# Patient Record
Sex: Female | Born: 1970 | Race: Black or African American | Hispanic: No | Marital: Married
Health system: Midwestern US, Community
[De-identification: ages and names within clinical notes are randomized; demographics above are authoritative.]

## PROBLEM LIST (undated history)

## (undated) DIAGNOSIS — M7540 Impingement syndrome of unspecified shoulder: Secondary | ICD-10-CM

## (undated) DIAGNOSIS — J40 Bronchitis, not specified as acute or chronic: Secondary | ICD-10-CM

## (undated) DIAGNOSIS — M1990 Unspecified osteoarthritis, unspecified site: Secondary | ICD-10-CM

## (undated) DIAGNOSIS — M719 Bursopathy, unspecified: Secondary | ICD-10-CM

## (undated) DIAGNOSIS — G43719 Chronic migraine without aura, intractable, without status migrainosus: Secondary | ICD-10-CM

## (undated) DIAGNOSIS — G43109 Migraine with aura, not intractable, without status migrainosus: Secondary | ICD-10-CM

## (undated) DIAGNOSIS — J45909 Unspecified asthma, uncomplicated: Secondary | ICD-10-CM

## (undated) DIAGNOSIS — K219 Gastro-esophageal reflux disease without esophagitis: Secondary | ICD-10-CM

## (undated) HISTORY — PX: UTERINE FIBROID SURGERY: SHX826

## (undated) HISTORY — PX: SHOULDER SURGERY: SHX246

## (undated) HISTORY — DX: Chronic migraine without aura, intractable, without status migrainosus: G43.719

## (undated) HISTORY — PX: BREAST SURGERY: SHX581

## (undated) HISTORY — PX: NECK SURGERY: SHX720

## (undated) HISTORY — DX: Gastro-esophageal reflux disease without esophagitis: K21.9

## (undated) HISTORY — DX: Migraine with aura, not intractable, without status migrainosus: G43.109

---

## 1997-10-28 ENCOUNTER — Other Ambulatory Visit: Admission: RE | Admit: 1997-10-28 | Discharge: 1997-10-28 | Payer: Self-pay | Admitting: Obstetrics and Gynecology

## 1999-01-07 ENCOUNTER — Other Ambulatory Visit: Admission: RE | Admit: 1999-01-07 | Discharge: 1999-01-07 | Payer: Self-pay | Admitting: Obstetrics and Gynecology

## 1999-01-21 ENCOUNTER — Other Ambulatory Visit: Admission: RE | Admit: 1999-01-21 | Discharge: 1999-01-21 | Payer: Self-pay | Admitting: Obstetrics and Gynecology

## 1999-01-21 ENCOUNTER — Encounter (INDEPENDENT_AMBULATORY_CARE_PROVIDER_SITE_OTHER): Payer: Self-pay | Admitting: Specialist

## 2000-07-18 ENCOUNTER — Inpatient Hospital Stay (HOSPITAL_COMMUNITY): Admission: AD | Admit: 2000-07-18 | Discharge: 2000-07-18 | Payer: Self-pay | Admitting: Obstetrics and Gynecology

## 2002-01-15 ENCOUNTER — Emergency Department (HOSPITAL_COMMUNITY): Admission: EM | Admit: 2002-01-15 | Discharge: 2002-01-16 | Payer: Self-pay | Admitting: Emergency Medicine

## 2002-01-16 ENCOUNTER — Encounter: Payer: Self-pay | Admitting: Emergency Medicine

## 2002-06-04 ENCOUNTER — Other Ambulatory Visit: Admission: RE | Admit: 2002-06-04 | Discharge: 2002-06-04 | Payer: Self-pay | Admitting: Obstetrics & Gynecology

## 2003-03-27 ENCOUNTER — Ambulatory Visit (HOSPITAL_COMMUNITY): Admission: RE | Admit: 2003-03-27 | Discharge: 2003-03-27 | Payer: Self-pay | Admitting: Gastroenterology

## 2003-08-09 ENCOUNTER — Other Ambulatory Visit: Admission: RE | Admit: 2003-08-09 | Discharge: 2003-08-09 | Payer: Self-pay | Admitting: Obstetrics & Gynecology

## 2004-06-04 ENCOUNTER — Emergency Department: Payer: Self-pay | Admitting: Emergency Medicine

## 2009-03-31 ENCOUNTER — Emergency Department (HOSPITAL_COMMUNITY): Admission: EM | Admit: 2009-03-31 | Discharge: 2009-03-31 | Payer: Self-pay | Admitting: Emergency Medicine

## 2009-04-11 ENCOUNTER — Emergency Department (HOSPITAL_COMMUNITY): Admission: EM | Admit: 2009-04-11 | Discharge: 2009-04-11 | Payer: Self-pay | Admitting: Family Medicine

## 2009-11-26 ENCOUNTER — Ambulatory Visit: Payer: Self-pay | Admitting: Orthopedic Surgery

## 2010-02-05 ENCOUNTER — Ambulatory Visit: Payer: Self-pay | Admitting: Pain Medicine

## 2010-02-18 ENCOUNTER — Ambulatory Visit: Payer: Self-pay | Admitting: Pain Medicine

## 2010-03-11 ENCOUNTER — Encounter
Admission: RE | Admit: 2010-03-11 | Discharge: 2010-03-11 | Payer: Self-pay | Source: Home / Self Care | Admitting: Neurosurgery

## 2010-07-09 ENCOUNTER — Other Ambulatory Visit: Payer: Self-pay | Admitting: Obstetrics and Gynecology

## 2010-07-09 DIAGNOSIS — Z1231 Encounter for screening mammogram for malignant neoplasm of breast: Secondary | ICD-10-CM

## 2010-07-15 ENCOUNTER — Ambulatory Visit
Admission: RE | Admit: 2010-07-15 | Discharge: 2010-07-15 | Disposition: A | Discharge status: Home / Self Care | Payer: BLUE CROSS/BLUE SHIELD | Source: Ambulatory Visit | Attending: Obstetrics and Gynecology | Admitting: Obstetrics and Gynecology

## 2010-07-15 DIAGNOSIS — Z1231 Encounter for screening mammogram for malignant neoplasm of breast: Secondary | ICD-10-CM

## 2010-08-21 NOTE — Op Note (Signed)
[name], Walls                         ACCOUNT NO.:  [id number]   MEDICAL RECORD NO.:  [id number]                   PATIENT TYPE:  AMB   LOCATION:  ENDO                                 FACILITY:  [facility]   PHYSICIAN:  Graylin Shiver, M.D.                DATE OF BIRTH:  1971/02/12   DATE OF PROCEDURE:  03/27/2003  DATE OF DISCHARGE:                                 OPERATIVE REPORT   PROCEDURE PERFORMED:  Colonoscopy.   INDICATIONS FOR PROCEDURE:  Rectal bleeding.   Informed consent was obtained after explanation of the risks of bleeding,  infection, and perforation.   PREMEDICATIONS:  Fentanyl 70 mcg  IV, Versed 7 mg IV.   DESCRIPTION OF PROCEDURE:  With the patient in the left lateral decubitus  position, a rectal exam was performed and no masses were felt.  The Olympus  colonoscope was inserted into the rectum and advanced around the colon to  the cecum.  Cecal landmarks were identified.  The cecum and ascending colon  were normal.  The transverse colon was normal.  The descending colon,  sigmoid and rectum were normal.  The scope was retroflexed in the rectum and  no abnormalities were seen. The scope was straightened and drawn out.  The  patient tolerated the procedure well without complications.   IMPRESSION:  Normal colonoscopy to the cecum.   I suspect that the bleeding that she had was probably an anal source.  I see  nothing on this examination to explain the bleeding.                                               Graylin Shiver, M.D.    SFG/MEDQ  D:  03/27/2003  T:  03/28/2003  Job:  [id number]   cc:   Schuyler Amor, M.D.  [address]  [place], [place] [address]  Fax: [phone]

## 2012-07-22 ENCOUNTER — Emergency Department (HOSPITAL_COMMUNITY)
Admission: EM | Admit: 2012-07-22 | Discharge: 2012-07-22 | Disposition: A | Discharge status: Home / Self Care | Payer: BC Managed Care – PPO | Attending: Emergency Medicine | Admitting: Emergency Medicine

## 2012-07-22 ENCOUNTER — Encounter (HOSPITAL_COMMUNITY): Payer: Self-pay

## 2012-07-22 DIAGNOSIS — Z79899 Other long term (current) drug therapy: Secondary | ICD-10-CM | POA: Insufficient documentation

## 2012-07-22 DIAGNOSIS — J029 Acute pharyngitis, unspecified: Secondary | ICD-10-CM

## 2012-07-22 DIAGNOSIS — R059 Cough, unspecified: Secondary | ICD-10-CM | POA: Insufficient documentation

## 2012-07-22 DIAGNOSIS — J45909 Unspecified asthma, uncomplicated: Secondary | ICD-10-CM | POA: Insufficient documentation

## 2012-07-22 DIAGNOSIS — R05 Cough: Secondary | ICD-10-CM | POA: Insufficient documentation

## 2012-07-22 HISTORY — DX: Unspecified asthma, uncomplicated: J45.909

## 2012-07-22 HISTORY — DX: Bronchitis, not specified as acute or chronic: J40

## 2012-07-22 LAB — RAPID STREP SCREEN (MED CTR MEBANE ONLY): Streptococcus, Group A Screen (Direct): NEGATIVE

## 2012-07-22 NOTE — ED Notes (Signed)
Sore throat 07/03/12. Seen and treated at urgent care for strep with Z pack.  Pt continues to be symptomatic.

## 2012-07-22 NOTE — ED Provider Notes (Signed)
History     CSN: [id number]  Arrival date & time 07/22/12  1107   First MD Initiated Contact with Patient 07/22/12 1204      Chief Complaint  Patient presents with  . Sore Throat    (Consider location/radiation/quality/duration/timing/severity/associated sxs/prior treatment) HPI Comments: 42 y.o. Female presents with sore throat x3 weeks. Was seen at urgent care (07/03/12) for same (pt says strep test was inconclusive) and treated with Z pack. Got better for a while, but now has sore throat again, although not as bad.  Pt denies fever. Admits dry cough and says she sees white spots on tonsills. Denies fever, nausea, vomiting, sinus pressure, rhinorrhea, ear pain.   Patient is a 42 y.o. female presenting with pharyngitis.  Sore Throat Associated symptoms include coughing and a sore throat. Pertinent negatives include no chest pain, chills, fever, headaches, nausea, neck pain, numbness, rash, vomiting or weakness.    Past Medical History  Diagnosis Date  . Asthma   . Bronchitis     Past Surgical History  Procedure Laterality Date  . Breast surgery      History reviewed. No pertinent family history.  History  Substance Use Topics  . Smoking status: Never Smoker   . Smokeless tobacco: Not on file  . Alcohol Use: Yes    OB History   Grav Para Term Preterm Abortions TAB SAB Ect Mult Living                  Review of Systems  Constitutional: Negative for fever and chills.  HENT: Positive for sore throat. Negative for ear pain, trouble swallowing, neck pain, neck stiffness and sinus pressure.   Eyes: Negative for visual disturbance.  Respiratory: Positive for cough. Negative for apnea, chest tightness, shortness of breath and wheezing.        Mild dry persistent cough  Cardiovascular: Negative for chest pain and palpitations.  Gastrointestinal: Negative for nausea, vomiting, diarrhea and constipation.  Genitourinary: Negative for dysuria.  Skin: Negative for rash.   Neurological: Negative for dizziness, weakness, light-headedness, numbness and headaches.    Allergies  Amoxicillin and Maxalt  Home Medications   Current Outpatient Rx  Name  Route  Sig  Dispense  Refill  . albuterol (PROVENTIL HFA;VENTOLIN HFA) 108 (90 BASE) MCG/ACT inhaler   Inhalation   Inhale 2 puffs into the lungs every 6 (six) hours as needed for wheezing.         Marland Kitchen aspirin-acetaminophen-caffeine (EXCEDRIN MIGRAINE) 250-250-65 MG per tablet   Oral   Take 1 tablet by mouth every 6 (six) hours as needed for pain.         . Biotin 1 MG CAPS   Oral   Take 1 g by mouth daily.         Marland Kitchen eletriptan (RELPAX) 40 MG tablet   Oral   One tablet by mouth at onset of headache. May repeat in 2 hours if headache persists or recurs. may repeat in 2 hours if necessary         . lidocaine (LIDODERM) 5 %   Transdermal   Place 1 patch onto the skin daily as needed. On foot. Remove & Discard patch within 12 hours or as directed by MD         . vitamin B-12 (CYANOCOBALAMIN) 1000 MCG tablet   Oral   Take 1,000 mcg by mouth daily.           BP 113/77  Pulse 84  Temp(Src) 98.7 F (  37.1 C) (Oral)  Resp 16  SpO2 100%  LMP 07/20/2012  Physical Exam  Nursing note and vitals reviewed. Constitutional: She is oriented to person, place, and time. She appears well-developed and well-nourished. No distress.  HENT:  Head: Normocephalic and atraumatic. No trismus in the jaw.  Right Ear: Tympanic membrane normal. No drainage.  Left Ear: Tympanic membrane normal. No drainage.  Nose: No mucosal edema or rhinorrhea. Right sinus exhibits no maxillary sinus tenderness and no frontal sinus tenderness. Left sinus exhibits no maxillary sinus tenderness and no frontal sinus tenderness.  Mouth/Throat: No edematous. Posterior oropharyngeal erythema present. No oropharyngeal exudate, posterior oropharyngeal edema or tonsillar abscesses.  Eyes: Conjunctivae and EOM are normal.  Neck: Normal  range of motion. Neck supple.  No meningeal signs  Cardiovascular: Normal rate, regular rhythm and normal heart sounds.  Exam reveals no gallop and no friction rub.   No murmur heard. Pulmonary/Chest: Effort normal and breath sounds normal. No respiratory distress. She has no wheezes. She has no rales. She exhibits no tenderness.  Abdominal: Soft. Bowel sounds are normal. She exhibits no distension. There is no tenderness. There is no rebound and no guarding.  Musculoskeletal: Normal range of motion. She exhibits no edema and no tenderness.  Neurological: She is alert and oriented to person, place, and time. No cranial nerve deficit.  Skin: Skin is warm and dry. She is not diaphoretic. No erythema.  Psychiatric: She has a normal mood and affect.    ED Course  Procedures (including critical care time)  Labs Reviewed  RAPID STREP SCREEN   No results found.   1. Viral pharyngitis       MDM  42 y.o. Female presents with sore throat x3 weeks. Treated with Z pack. Got better for a while, but now has sore throat again, although not as bad.  Afebrile, dry cough, without tonsillar exudates, mild erythema. Negative strep. DC w symptomatic tx.  Pt does not appear dehydrated, but did discuss importance of water rehydration. Presentation non concerning for PTA or infxn spread to soft tissue. No trismus or uvula deviation. Specific return precautions discussed.  Recommended PCP follow up. Provided resource guide.    Glade Nurse, PA-C 07/22/12 [phone]

## 2012-07-22 NOTE — ED Provider Notes (Signed)
Medical screening examination/treatment/procedure(s) were performed by non-physician practitioner and as supervising physician I was immediately available for consultation/collaboration.  Twanda Stakes, MD 07/22/12 1652 

## 2013-01-19 ENCOUNTER — Telehealth: Payer: Self-pay | Admitting: Neurology

## 2013-01-24 NOTE — Telephone Encounter (Signed)
Office in [place] will fax over new referral for a scheduled appt. W/ Dr Vickey Huger

## 2013-01-29 ENCOUNTER — Encounter: Payer: Self-pay | Admitting: Neurology

## 2013-01-29 ENCOUNTER — Encounter (INDEPENDENT_AMBULATORY_CARE_PROVIDER_SITE_OTHER): Payer: Self-pay

## 2013-01-29 ENCOUNTER — Ambulatory Visit (INDEPENDENT_AMBULATORY_CARE_PROVIDER_SITE_OTHER): Payer: BC Managed Care – PPO | Admitting: Neurology

## 2013-01-29 VITALS — BP 143/84 | HR 80 | Temp 97.9°F | Ht 59.5 in | Wt 209.0 lb

## 2013-01-29 DIAGNOSIS — Z8249 Family history of ischemic heart disease and other diseases of the circulatory system: Secondary | ICD-10-CM

## 2013-01-29 DIAGNOSIS — G43719 Chronic migraine without aura, intractable, without status migrainosus: Secondary | ICD-10-CM

## 2013-01-29 DIAGNOSIS — E669 Obesity, unspecified: Secondary | ICD-10-CM

## 2013-01-29 DIAGNOSIS — Z823 Family history of stroke: Secondary | ICD-10-CM

## 2013-01-29 DIAGNOSIS — R51 Headache: Secondary | ICD-10-CM

## 2013-01-29 DIAGNOSIS — R0683 Snoring: Secondary | ICD-10-CM

## 2013-01-29 DIAGNOSIS — R0609 Other forms of dyspnea: Secondary | ICD-10-CM

## 2013-01-29 MED ORDER — PROPRANOLOL HCL ER 80 MG PO CP24: 80.0000 mg | ORAL_CAPSULE | Freq: Every day | ORAL | Status: AC

## 2013-01-29 NOTE — Patient Instructions (Signed)
Please remember, common headache triggers are: sleep deprivation, dehydration, overheating, stress, hypoglycemia or skipping meals and blood sugar fluctuations, excessive pain medications or excessive alcohol use or caffeine withdrawal. Some people have food triggers such as aged cheese, orange juice or chocolate, especially dark chocolate, or MSG (monosodium glutamate). Try to avoid these headache triggers as much possible. It may be helpful to keep a headache diary to figure out what makes your headaches worse or brings them on and what alleviates them. Some people report headache onset after exercise but studies have shown that regular exercise may actually prevent headaches from coming. If you have exercise-induced headaches, please make sure that you drink plenty of fluid before and after exercising and that you do not over do it and do not overheat.  Please remember to try to maintain good sleep hygiene, which means: Keep a regular sleep and wake schedule, try not to exercise or have a meal within 2 hours of your bedtime, try to keep your bedroom conducive for sleep, that is, cool and dark, without light distractors such as an illuminated alarm clock, and refrain from watching TV right before sleep or in the middle of the night and do not keep the TV or radio on during the night. Also, try not to use or play on electronic devices at bedtime, such as your cell phone, tablet PC or laptop. If you like to read at bedtime on an electronic device, try to dim the background light as much as possible.  Based on your symptoms and your exam I believe you are at risk for obstructive sleep apnea or OSA, and I think we should proceed with a sleep study to determine whether you do or do not have OSA and how severe it is. If you have more than mild OSA, I want you to consider treatment with CPAP. Please remember, the risks and ramifications of moderate to severe obstructive sleep apnea or OSA are: Cardiovascular disease,  including congestive heart failure, stroke, difficult to control hypertension, arrhythmias, and even type 2 diabetes has been linked to untreated OSA. Sleep apnea causes disruption of sleep and sleep deprivation in most cases, which, in turn, can cause recurrent headaches, problems with memory, mood, concentration, focus, and vigilance. Most people with untreated sleep apnea report excessive daytime sleepiness, which can affect their ability to drive. Please do not drive if you feel sleepy.  I will see you back after your sleep study to go over the test results and where to go from there. We will call you after your sleep study and to set up an appointment at the time.

## 2013-01-29 NOTE — Progress Notes (Signed)
Subjective:    Patient ID: Rebecca Walls is a 42 y.o. female.  HPI  Huston Foley, MD, PhD [facility] [address], [address] [address] [place], [place] [address]  Dear Dr. Yetta Barre,   I saw your patient, Rebecca Walls, upon your kind request in my neurologic clinic today for initial consultation of her migraine headaches. The patient is unaccompanied today. As you know, Rebecca Walls is a very pleasant 42 year old right-handed woman with an underlying medical history of reflux disease and uterine fibroids, who has had intermittent headaches for over 10 years. She has seen neurologists in the past and has recently been following with Dr. Modesta Messing. She has also seen Dr. Vickey Huger in 2010 and our NP, Rebecca Daphine Deutscher in 2011, last about a year ago.   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 She has a throbbing HA, and has daily HAs for the past 2 years. She is sensitive to smells, especially fragrances. She left 3 jobs, because of perfumes at work. 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 She has a nuclear stress test coming up this week. She has neck pain. She has associated nausea, some vomiting. She has 30 HA days/month. She has a family history of migraine and brain aneurysm. She has had 3 or 4 brain MRI, per patient, she has not had a MRA head.  Treatments tried include imipramine, topamax, Inderal, Zonegran, trigger point injections, cymbalta, sertraline, atenolol, all without success and some with SEs. She has tried Relpax, Imitrex, and Maxalt, all without help. She has not tried Botox, but has considered it with Dr. Modesta Messing, her neurologist in [facility].   The patient denies prior TIA or stroke symptoms, such as sudden onset of one sided weakness, numbness, tingling, slurring of speech or droopy face, hearing loss, tinnitus, diplopia or visual field cut or monocular loss of vision, but in 2006, she had auras and one time had a  migraine with complication. Of note, the patient admits to snoring, and there is no report of witnessed apneas or choking sensations while asleep, but she has woken up with palpitations. There is associated EDS and the ESS is 4/24 today. She reports morning HAs and tooth grinding at night and she saw ENT for TMJ problems; she was told she needed a mouth guard.  Her Past Medical History Is Significant For: Past Medical History  Diagnosis Date  . Asthma   . Bronchitis   . Migraine with aura, without mention of intractable migraine without mention of status migrainosus   . GERD (gastroesophageal reflux disease)     Her Past Surgical History Is Significant For: Past Surgical History  Procedure Laterality Date  . Breast surgery      reduction  . Uterine fibroid surgery      Her Family History Is Significant For: Family History  Problem Relation Age of Onset  . Diabetes Mother   . COPD Mother   . Hypertension Father   . Diabetes Father     Her Social History Is Significant For: History   Social History  . Marital Status: Married    Spouse Name: N/A    Number of Children: N/A  . Years of Education: N/A   Social History Main Topics  . Smoking status: Never Smoker   . Smokeless tobacco: None  . Alcohol Use: Yes  . Drug Use: No  . Sexual Activity: None   Other Topics Concern  .  None   Social History Narrative  . None    Her Allergies Are:  Allergies  Allergen Reactions  . Amoxicillin Hives, Swelling and Rash    Throat closes in  . Maxalt [Rizatriptan] Hives, Swelling and Rash    Throat closes in  :   Her Current Medications Are:  Outpatient Encounter Prescriptions as of 01/29/2013  Medication Sig Dispense Refill  . albuterol (PROVENTIL HFA;VENTOLIN HFA) 108 (90 BASE) MCG/ACT inhaler Inhale 2 puffs into the lungs every 6 (six) hours as needed for wheezing.      . Biotin 1 MG CAPS Take 1 g by mouth daily.      . butalbital-acetaminophen-caffeine (FIORICET, ESGIC)  50-325-40 MG per tablet Take 1 tablet by mouth as needed.      . isometheptene-acetaminophen-dichloralphenazone (MIDRIN) 65-325-100 MG capsule Take 1 capsule by mouth 4 (four) times daily as needed. Maximum 5 capsules in 12 hours for migraine headaches, 8 capsules in 24 hours for tension headaches.      . lidocaine (LIDODERM) 5 % Place 1 patch onto the skin daily as needed. On foot. Remove & Discard patch within 12 hours or as directed by MD      . propranolol ER (INDERAL LA) 60 MG 24 hr capsule Take 1 capsule by mouth at bedtime.      . vitamin B-12 (CYANOCOBALAMIN) 1000 MCG tablet Take 1,000 mcg by mouth daily.      Marland Kitchen aspirin-acetaminophen-caffeine (EXCEDRIN MIGRAINE) 250-250-65 MG per tablet Take 1 tablet by mouth every 6 (six) hours as needed for pain.      Marland Kitchen eletriptan (RELPAX) 40 MG tablet One tablet by mouth at onset of headache. May repeat in 2 hours if headache persists or recurs. may repeat in 2 hours if necessary       No facility-administered encounter medications on file as of 01/29/2013.  :  Review of Systems:  Out of a complete 14 point review of systems, all are reviewed and negative with the exception of these symptoms as listed below:  Review of Systems  Constitutional: Positive for fatigue.  Eyes: Negative.   Respiratory: Positive for shortness of breath.   Cardiovascular: Positive for chest pain, palpitations and leg swelling.  Gastrointestinal: Positive for diarrhea, constipation and blood in stool.  Endocrine: Positive for polydipsia.  Genitourinary: Negative.   Musculoskeletal: Positive for arthralgias, joint swelling and myalgias.       Muscle cramps  Skin: Positive for rash.  Allergic/Immunologic: Positive for environmental allergies.  Neurological: Positive for dizziness, tremors, weakness and headaches.       Restless leg  Hematological:       Anemia  Psychiatric/Behavioral: Positive for confusion. The patient is nervous/anxious.     Objective:  Neurologic  Exam  Physical Exam Physical Examination:   Filed Vitals:   01/29/13 0955  BP: 143/84  Pulse: 80  Temp: 97.9 F (36.6 C)   General Examination: The patient is a very pleasant 42 y.o. female in mild distress. She is tearful; she appears well-developed and well-nourished and well groomed. She is overweight.  HEENT: Normocephalic, atraumatic, pupils are equal, round and reactive to light and accommodation. She appears to have mildly bulging eyes. She states that she's been checked for thyroid function. Funduscopic exam is normal with sharp disc margins noted. Extraocular tracking is good without limitation to gaze excursion or nystagmus noted. Normal smooth pursuit is noted. Hearing is grossly intact. Tympanic membranes are clear bilaterally. Face is symmetric with normal facial animation and normal facial  sensation. Speech is clear with no dysarthria noted. There is no hypophonia. There is no lip, neck/head, jaw or voice tremor. Neck is supple with full range of passive and active motion. There are no carotid bruits on auscultation. Oropharynx exam reveals: mild mouth dryness, adequate dental hygiene and moderate airway crowding, due to narrow airway and elongated uvula and tonsils in place. Mallampati is class II. Tongue protrudes centrally and palate elevates symmetrically. Tonsils are 2+ in size. Neck size is 15.75 inches.   Chest: Clear to auscultation without wheezing, rhonchi or crackles noted.  Heart: S1+S2+0, regular and normal without murmurs, rubs or gallops noted.   Abdomen: Soft, non-tender and non-distended with normal bowel sounds appreciated on auscultation.  Extremities: There is no pitting edema in the distal lower extremities bilaterally. Pedal pulses are intact.  Skin: Warm and dry without trophic changes noted. There are no varicose veins.  Musculoskeletal: exam reveals no obvious joint deformities, tenderness or joint swelling or erythema.   Neurologically:  Mental  status: The patient is awake, alert and oriented in all 4 spheres. Her memory, attention, language and knowledge are appropriate. There is no aphasia, agnosia, apraxia or anomia. Speech is clear with normal prosody and enunciation. Thought process is linear. Mood is congruent and affect is blunted.  Cranial nerves are as described above under HEENT exam. In addition, shoulder shrug is normal with equal shoulder height noted. Motor exam: Normal bulk, strength and tone is noted. There is no drift, tremor or rebound. Romberg is negative. Reflexes are 2+ throughout. Toes are downgoing bilaterally. Fine motor skills are intact with normal finger taps, normal hand movements, normal rapid alternating patting, normal foot taps and normal foot agility.  Cerebellar testing shows no dysmetria or intention tremor on finger to nose testing. Heel to shin is unremarkable bilaterally. There is no truncal or gait ataxia.  Sensory exam is intact to light touch, pinprick, vibration, temperature sense and proprioception in the upper and lower extremities.  Gait, station and balance are unremarkable. No veering to one side is noted. No leaning to one side is noted. Posture is age-appropriate and stance is narrow based. No problems turning are noted. She turns en bloc. Tandem walk is unremarkable. Intact toe and heel stance is noted.               Assessment and Plan:   In summary, Randie Bloodgood is a very pleasant 42 y.o.-year old female with an underlying medical history of asthma, whose history and exam are in keeping with a diagnosis of migraine without aura of several year duration. Her physical exam is non-focal. She is reassured the patient in that regard.  I had a long chat with the patient about my findings and the diagnosis of intractable migraines, the prognosis and treatment options. We talked about medical treatments and non-pharmacological approaches. We talked about maintaining a healthy lifestyle in general. I  encouraged the patient to eat healthy, exercise daily and keep well hydrated, to keep a scheduled bedtime and wake time routine, to not skip any meals and eat healthy snacks in between meals and to have protein with every meal.   I advised the patient about common headache triggers: sleep deprivation, dehydration, overheating, stress, hypoglycemia or skipping meals and blood sugar fluctuations, excessive pain medications or excessive alcohol use or caffeine withdrawal. Some people have food triggers such as aged cheese, orange juice or chocolate, especially dark chocolate, or MSG (monosodium glutamate). She is to try to avoid these headache triggers  as much possible. It may be helpful to keep a headache diary to figure out what makes Her headaches worse or brings them on and what alleviates them. Some people report headache onset after exercise but studies have shown that regular exercise may actually prevent headaches from coming. If She has exercise-induced headaches, She is advised to drink plenty of fluid before and after exercising and that to not overdo it and to not overheat.  As far as further diagnostic testing is concerned, I suggested the following today: MRA of brain w/o Gad for a FHx of brain aneurysm. I think, she is at risk for OSA, based on obesity and a tigher airway, but she reports mild snoring and no apneas, but palpitations and AM HAs. She is advised that I would recommend a sleep study as well. I explained in particular the risks and ramifications of untreated moderate to severe OSA, especially with respect to developing cardiovascular disease down the Road, including congestive heart failure, difficult to treat hypertension, cardiac arrhythmias, or stroke. Even type 2 diabetes has in part been linked to untreated OSA.    I explained the sleep test procedure to the patient and also outlined possible surgical and non-surgical treatment options of OSA, including the use of a custom-made  dental device, upper airway surgical options, such as pillar implants, radiofrequency surgery, tongue base surgery, and UPPP. I also explained the CPAP treatment option to the patient, who indicated that she would be willing to try CPAP if the need arises. I explained the importance of being compliant with PAP treatment, not only for insurance purposes but primarily to improve Her symptoms, and for the patient's long term health benefit, including to reduce Her cardiovascular risks.   I think she would be a good candidate for Botox injections; I talked to her at length about botulinum toxin injections, the side effects, benefits, limitations as well as expectations. I obtained written informed consent for insurance authorization purposes and also gave her additional information about Botox injections. While we're obtaining more diagnostic tests as well as the Botox insurance authorization, I would like to suggest an increase in her Inderal LA to 80 mg daily. I suggested her prescription in that regard. She has Zofran for as needed use. I would like to see her back after the sleep study and her MRA are completed and we will also pick up our conversation about Botox injections soon. She was in agreement.    Most of my 60 minute visit today was spent in counseling and coordination of care, reviewing test results and reviewing medication.  Thank you very much for allowing me to participate in the care of this nice patient. If I can be of any further assistance to you please do not hesitate to call me at [phone].  Sincerely,   Huston Foley, MD, PhD

## 2013-01-31 ENCOUNTER — Ambulatory Visit (INDEPENDENT_AMBULATORY_CARE_PROVIDER_SITE_OTHER): Payer: BC Managed Care – PPO | Admitting: *Deleted

## 2013-01-31 ENCOUNTER — Telehealth: Payer: Self-pay | Admitting: Family Medicine

## 2013-01-31 ENCOUNTER — Encounter: Payer: Self-pay | Admitting: *Deleted

## 2013-01-31 ENCOUNTER — Telehealth: Payer: Self-pay | Admitting: *Deleted

## 2013-01-31 VITALS — HR 87

## 2013-01-31 DIAGNOSIS — R0683 Snoring: Secondary | ICD-10-CM

## 2013-01-31 DIAGNOSIS — E669 Obesity, unspecified: Secondary | ICD-10-CM

## 2013-01-31 DIAGNOSIS — R0609 Other forms of dyspnea: Secondary | ICD-10-CM

## 2013-01-31 DIAGNOSIS — Z8669 Personal history of other diseases of the nervous system and sense organs: Secondary | ICD-10-CM

## 2013-01-31 DIAGNOSIS — G4720 Circadian rhythm sleep disorder, unspecified type: Secondary | ICD-10-CM

## 2013-01-31 DIAGNOSIS — G4713 Recurrent hypersomnia: Secondary | ICD-10-CM

## 2013-01-31 DIAGNOSIS — R51 Headache: Secondary | ICD-10-CM

## 2013-01-31 NOTE — Telephone Encounter (Signed)
Arranged for patient to come by today to receive instruction and pick up HST unit.  She is interested in shipping the unit back to [place] which we discussed would be fine as long as she insured the package and we received it back in a timely way.  She understood.  I will be sure to give her an address for shipping purposes when she comes in today.

## 2013-01-31 NOTE — Sleep Study (Signed)
Patient arrives for HST instruction.  Patient is given written instructions, picture instructions, and a demonstration on how to use HST unit.  All questions / concerns were addressed by technologist.  Financial responsibility was explained.  Follow up information was given to patient regarding how results would be received.  Patient lives in [place] and will ship the HST back to [place], expected return date is 02/09/13. -sh

## 2013-01-31 NOTE — Addendum Note (Signed)
Addended by: Bonita Quin B on: 01/31/2013 10:54 AM   Modules accepted: Orders

## 2013-02-02 ENCOUNTER — Telehealth: Payer: Self-pay | Admitting: Neurology

## 2013-02-05 ENCOUNTER — Telehealth: Payer: Self-pay | Admitting: Neurology

## 2013-02-05 DIAGNOSIS — G43909 Migraine, unspecified, not intractable, without status migrainosus: Secondary | ICD-10-CM

## 2013-02-06 NOTE — Telephone Encounter (Signed)
I had increased her Inderal LA to 80 mg once daily. This may still need some more time to kick in fully. In the meantime I would suggest that she bridge her symptoms with over-the-counter medications, alternating Advil or Motrin if she can with Tylenol. It is difficult for me to advise anything else at this time as she has tried multiple preventative and abortive medications. Please advise patient.

## 2013-02-06 NOTE — Telephone Encounter (Signed)
I returned patients calls and reviewed all of her concerns:  Medication change request:      I have discussed with Dr. Frances Furbish patient's concern that Midrin is not working. Dr. Frances Furbish will review her record and make recommendation as appropriate. I will ask Dr. Frances Furbish to send ant new script to her [place] [facility] at Fax# [phone].     MRA has been approved and she should have heard from Ms. Mansir about this. Patient states she has. Botox takes about two weeks to get response on. Wait another week. The sleep student equipment should arrive today. Staff are tracking it. No worries. Dr. Frances Furbish will not give patient note to be out of work longer for migraines. She recommends that patient request any extension be obtained from her primary care provider.   Patient thanked me for the information.

## 2013-02-09 NOTE — Telephone Encounter (Signed)
Pt will be contacted regarding receipt of hst equipment.

## 2013-02-12 ENCOUNTER — Telehealth: Payer: Self-pay | Admitting: Neurology

## 2013-02-12 NOTE — Telephone Encounter (Signed)
Pt is again calling with concerns of our receipt of the hst unit and also with the results.  Please return call to the patient and advise.

## 2013-02-12 NOTE — Telephone Encounter (Signed)
I spoke with Dr. Frances Furbish. She recommends that if headaches that bad, follow up with urgent care. Patient stated that she has been to urgent care. They said they can't help her and her headaches are worse because she gets rebound headaches from taking OTC medications. She has never heard of taking OTCs with Rx meds for headaches, patient is not your average migraine patient. 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 Her  Local neurologist whop she had seen for a short time had ordered her ondansetron 4 mg. For nausea and wants to see if Dr. Frances Furbish will order that. I will let her know. I also advised her that Dr. Frances Furbish feels that it may be helpful to try bio-feedback and she should request that her PCP refer her to someone nearby, as Dr. Frances Furbish is not familiar with anyone in patients area.    Patient would like to see if she can be referred back to Dr. Frances Furbish or Vella Redhead, NP. I let patient know I will check into that.

## 2013-02-13 MED ORDER — ONDANSETRON HCL 4 MG PO TABS: 4.0000 mg | ORAL_TABLET | Freq: Two times a day (BID) | ORAL | Status: AC | PRN

## 2013-02-13 NOTE — Telephone Encounter (Signed)
Patient is requesting results.

## 2013-02-13 NOTE — Telephone Encounter (Signed)
Addressed in other note.

## 2013-02-13 NOTE — Telephone Encounter (Signed)
Please see if Eber Jones has an opening to see pt. I will talk to Eber Jones about it too. Ondansetron Rx done and sent to pharm on file.

## 2013-02-13 NOTE — Telephone Encounter (Signed)
Pt called and notified that HST unit arrived safely.  Told her we will hopefully be able to give her results by end of week.  She understood.

## 2013-02-13 NOTE — Addendum Note (Signed)
Addended by: Huston Foley on: 02/13/2013 11:44 AM   Modules accepted: Orders

## 2013-02-13 NOTE — Telephone Encounter (Signed)
Addressed in another note

## 2013-02-13 NOTE — Telephone Encounter (Signed)
Note completed 

## 2013-02-16 NOTE — Sleep Study (Signed)
See media tab for full report  

## 2013-02-21 NOTE — Telephone Encounter (Signed)
Pt was contacted and informed that her HST showed no significant sleep disordered breathing.  A copy will be mailed to her at home and will also be forwarded to Dr. Yetta Barre. -sh

## 2013-03-05 ENCOUNTER — Encounter: Payer: Self-pay | Admitting: Neurology

## 2013-03-05 ENCOUNTER — Ambulatory Visit (INDEPENDENT_AMBULATORY_CARE_PROVIDER_SITE_OTHER): Payer: BC Managed Care – PPO | Admitting: Neurology

## 2013-03-05 ENCOUNTER — Encounter (INDEPENDENT_AMBULATORY_CARE_PROVIDER_SITE_OTHER): Payer: Self-pay

## 2013-03-05 VITALS — BP 137/86 | HR 82 | Temp 97.8°F | Ht 59.5 in

## 2013-03-05 DIAGNOSIS — G43719 Chronic migraine without aura, intractable, without status migrainosus: Secondary | ICD-10-CM

## 2013-03-05 HISTORY — DX: Chronic migraine without aura, intractable, without status migrainosus: G43.719

## 2013-03-05 MED ORDER — ONABOTULINUMTOXINA 100 UNITS IJ SOLR
200.0000 [IU] | Freq: Once | INTRAMUSCULAR | Status: AC
  Administered 2013-03-05: 200 [IU] via INTRAMUSCULAR

## 2013-03-05 NOTE — Patient Instructions (Signed)
As discussed, botulinum toxin takes about 3-7 days to kick in. Please remember, this is not a pain shot, this is to prevent headaches. In some patients it takes up to 2-3 weeks to make a difference and it wears off with time. Sometimes it may wear off before it is time for the next injection. We still should wait till the next 3 monthly injection, because injecting too frequently may cause you to develop immunity to the botulinum toxin. We are looking for a reduction in your headache frequency and headache severity. Side effects to look out for are mouth dryness, dryness of the eyes, heaviness of your head or muscle weakness, rarely, speech or swallowing difficulties and very rarely breathing difficulties. Some people have transient neck pain or soreness which typically responds to over-the-counter anti-inflammatory medication and local heat application with a heat pad. If you think you have a severe reaction to the botulinum toxin you have to call 911 or have someone take you to the nearest emergency room. However, most people have no side effects from the injections. It is normal to have a little bit of redness and swelling around the injection sites which usually improves after a few hours. Rarely, there may be a bruise that improves on its own. Most side effects reported are very mild and resolve within 10-14 days. Please feel free to call us if you have any additional questions or concerns: [phone].

## 2013-03-05 NOTE — Progress Notes (Addendum)
Subjective:    Patient ID: Rebecca Walls is a 42 y.o. female.  HPI   Rebecca Walls is a very pleasant 42 y.o. 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 The patient is unaccompanied today. She has had migraine headaches for over 10 years and has tried and failed multiple abortive and preventive medications.  I first met the patient on 01/29/2013, at which time I took a full history and did a full physical exam. Please refer to my note from that visit regarding details of Her history and exam as well as medications tried. Written informed consent for recurrent, 3 monthly intramuscular injections with botulinum toxin for this indication has been obtained and will be scanned into the patient's electronic chart. I will re-consent if the type of botulinum toxin used or the indication for injection changes for this patient in the future. The patient is informed that we will use the same consent for Her recurrent, most likely 3 monthly injections. She demonstrated understanding and voiced agreement. I talked to the patient in detail about expectations, limitations, benefits as well as potential adverse effects of botulinum toxin injections. The patient understands that the side effects include (but are not limited to): Mouth dryness, dryness of eyes, speech and swallowing difficulties, respiratory depression or problems breathing, weakness of muscles including more distant muscles than the ones injected, flu-like symptoms, myalgias, injection site reactions such as redness, itching, swelling, pain, and infection.  The patient was situated in a chair, sitting comfortably. After preparing the areas with 70% isopropyl alcohol and using a 26 gauge 1 1/2 inch hollow lumen recording EMG needle for the neck injections as well as a 30 gauge 1 inch needle for the facial injections, a total dose of 130 units of botulinum toxin  type A in the form of Botox was injected into the muscles and the following distribution and quantities:  #1: 10 units on the right and 10 units in the left frontalis muscles. #2: 5 units in the right and 5 units in the left corrugator muscles. #3: 10 units on the right and 10 units in the left occipitalis muscles.  #4: 10 units in the right and 10 units in the left temporalis muscles.   #5: 15 units on the right and 15 units in the left upper trapezius muscles, broken down in 2 sites on each side. #6: 10 units in the right and 10 units in the left splenius capitis muscles.  #7: 5 units on the right and 5 units in the left procerus muscles.  EMG guidance was utilized for the neck injections with mild EMG activity noted, especially in the bilateral splenius capitis muscles.  A dose of 70 units out of a total dose of 200 units was discarded as unavoidable waste.   The patient tolerated the procedure well without immediate complications. She was advised to make a followup appointment for repeat injections in 3 months from now and encouraged to call us with any interim questions, concerns, problems, or updates. She was in agreement and did not have any questions prior to leaving clinic today.  The patient reports that she has seen a neurosurgeon and that she may have neck surgery. She is still in the process of deciding. I have asked her not to have any surgery is rescheduled procedures within the first 6 weeks of botulinum toxin injection just as a precaution, as surgeries involve anesthesia and anesthesia most typically include some  muscle relaxation and we don't want any additional muscle weakness to potentially cause her any side effects. She understood and was in agreement. Of note, the patient had a home sleep test on 02/16/2013 which did not show any significant sleep disordered breathing and an estimated AHI of 2 per hour.   Review of Systems Neurologic Exam  Physical Exam

## 2013-03-21 ENCOUNTER — Telehealth: Payer: Self-pay | Admitting: Neurology

## 2013-03-21 NOTE — Telephone Encounter (Signed)
Please advise 

## 2013-03-22 ENCOUNTER — Telehealth: Payer: Self-pay | Admitting: *Deleted

## 2013-03-22 NOTE — Telephone Encounter (Signed)
Patient said that she is having a stabbing pain in shoulder/blade, painful,meds(tramadol, gabapentin)- not helping.  She wanted to know if this is common after botox treatment(03/05/13)?  She has a herniated disc, could pain  be coming from that instead?

## 2013-03-23 NOTE — Telephone Encounter (Signed)
I called patient back regarding her neck soreness. She is describing painful sore areas around her shoulder area and posterior neck area bilaterally. She also has a concomitant diagnosis of degenerative cervical spine disease as well as fibromyalgia. I explained to her that patients who receive Botox injections especially Botox nave patients can sometimes have a transient neck soreness and stiffness after the injections which typically goes away after 2-3 weeks. Over-the-counter use of anti-inflammatory medications and local heat can often help. I have seen in a few patients of mine who have concomitant fibromyalgia that Botox can sometimes cause a transient flareup in her fibromyalgia symptoms. However I have also had patients who had improvement in her fibromyalgia symptoms after Botox injections. Since she has multiple neck related issues including the migraine headaches, fibromyalgia and degenerative spine disease it is difficult to tease out what is exactly flareup at this time and what is causing her symptoms. Regarding the Botox injections, neck soreness is not a sign of a sinister side effects and she may be able to notice any improvement in the next few days as she will pass the 3 week mark. She is encouraged to consider massage therapy or local heat with anti-inflammatory medication. She also indicated that she may be restarting physical therapy through her neurosurgeon. She is in agreement and will call his back with any interim questions. She also wanted to for me to send a letter to her pain management Center called [facility] regarding my referral for biofeedback. I would be happy to furnish a letter to them indicating that I have recommended biofeedback as part of her treatment for chronic migraine headaches. She will call us back with details as to where to send a letter and who to address it to.

## 2013-04-26 ENCOUNTER — Telehealth: Payer: Self-pay | Admitting: Neurology

## 2013-04-26 DIAGNOSIS — G43719 Chronic migraine without aura, intractable, without status migrainosus: Secondary | ICD-10-CM

## 2013-04-26 MED ORDER — BUTALBITAL-APAP-CAFFEINE 50-325-40 MG PO TABS
1.0000 | ORAL_TABLET | ORAL | Status: AC | PRN
Start: 2013-04-26 — End: ?

## 2013-04-26 NOTE — Telephone Encounter (Signed)
I can prescribe Fioricet for as needed use. Rx placed. She can come pick up or we can mail. thx

## 2013-04-26 NOTE — Telephone Encounter (Signed)
Needs refill on RX Butal-acet-caff filled because her PCP was filling it but he says not she is seeing neurologist that is who should be filling it please call

## 2013-04-26 NOTE — Telephone Encounter (Signed)
Dr Frances FurbishAthar, Would you like to start prescribing Fioricet?  Please advise.  Thank you.

## 2013-05-07 ENCOUNTER — Telehealth: Payer: Self-pay | Admitting: Neurology

## 2013-05-07 NOTE — Telephone Encounter (Signed)
I did not contact this patient today.  By viewing her chart, Dr Frances FurbishAthar authorized generic Fioricet on 01/22.  I called the patient back.  Got no answer.  The mailbox was full.  Unable to LM.  I called the pharmacy.  They were unable to locate the Rx we sent.  I gave verbal order for Rx that was auth on 01/22.

## 2013-05-07 NOTE — Telephone Encounter (Signed)
Patient returning call from someone at our office, states it may be related to her Fioricet script that she was requesting. Patient states her pharmacy said something else was called in instead. Please call patient and advise.

## 2013-06-06 ENCOUNTER — Ambulatory Visit: Payer: BC Managed Care – PPO | Admitting: Neurology

## 2013-06-11 ENCOUNTER — Ambulatory Visit: Payer: BC Managed Care – PPO | Admitting: Neurology

## 2014-02-25 ENCOUNTER — Encounter

## 2014-02-25 ENCOUNTER — Inpatient Hospital Stay: Admit: 2014-02-25 | Payer: BLUE CROSS/BLUE SHIELD

## 2014-02-25 DIAGNOSIS — Z01818 Encounter for other preprocedural examination: Secondary | ICD-10-CM

## 2014-02-25 LAB — CBC WITH AUTOMATED DIFF
ABS. BASOPHILS: 0 10*3/uL (ref 0.0–0.06)
ABS. EOSINOPHILS: 0.1 10*3/uL (ref 0.0–0.4)
ABS. LYMPHOCYTES: 2.8 10*3/uL (ref 0.9–3.6)
ABS. MONOCYTES: 0.8 10*3/uL (ref 0.05–1.2)
ABS. NEUTROPHILS: 6.1 10*3/uL (ref 1.8–8.0)
BASOPHILS: 0 % (ref 0–2)
EOSINOPHILS: 1 % (ref 0–5)
HCT: 36 % (ref 35.0–45.0)
HGB: 11.3 g/dL — ABNORMAL LOW (ref 12.0–16.0)
LYMPHOCYTES: 29 % (ref 21–52)
MCH: 23.7 PG — ABNORMAL LOW (ref 24.0–34.0)
MCHC: 31.4 g/dL (ref 31.0–37.0)
MCV: 75.5 FL (ref 74.0–97.0)
MONOCYTES: 8 % (ref 3–10)
MPV: 10.1 FL (ref 9.2–11.8)
NEUTROPHILS: 62 % (ref 40–73)
PLATELET: 274 10*3/uL (ref 135–420)
RBC: 4.77 M/uL (ref 4.20–5.30)
RDW: 16.3 % — ABNORMAL HIGH (ref 11.6–14.5)
WBC: 9.9 10*3/uL (ref 4.6–13.2)

## 2014-02-25 LAB — METABOLIC PANEL, BASIC
Anion gap: 6 mmol/L (ref 3.0–18)
BUN/Creatinine ratio: 18 (ref 12–20)
BUN: 16 MG/DL (ref 7.0–18)
CO2: 29 mmol/L (ref 21–32)
Calcium: 9 MG/DL (ref 8.5–10.1)
Chloride: 103 mmol/L (ref 100–108)
Creatinine: 0.88 MG/DL (ref 0.6–1.3)
GFR est AA: >60 mL/min/{1.73_m2} (ref >60)
GFR est non-AA: >60 mL/min/{1.73_m2} (ref >60)
Glucose: 88 mg/dL (ref 74–99)
Potassium: 5 mmol/L (ref 3.5–5.5)
Sodium: 138 mmol/L (ref 136–145)

## 2014-02-25 LAB — MIH FAX RESULT: FAX TO NUMBER: 5918560

## 2014-02-25 LAB — EKG, 12 LEAD, INITIAL
Atrial Rate: 84 {beats}/min
Calculated P Axis: 43 degrees
Calculated R Axis: 76 degrees
Calculated T Axis: 78 degrees
Diagnosis: NORMAL
P-R Interval: 126 ms
Q-T Interval: 368 ms
QRS Duration: 76 ms
QTC Calculation (Bezet): 434 ms
Ventricular Rate: 84 {beats}/min

## 2014-02-25 NOTE — Other (Signed)
No sleep apnea. Had a negative sleep study. No removable prosthetic devices. Pt has care fusion kit and instructions. Reviewed this and day of surgery wipes. No family history of malignant hyperthermia. Pt does not have a PCP. Uses 6 liters of Oxygen for 30 minutes at the onset of migraines.

## 2014-03-04 NOTE — H&P (Signed)
Robin Ramsey, Ameerah  Account #:  [id number]  Date of Birth:  [birth year]    Chief Complaint:  Right shoulder pain.    History of Chief Complaint:  Robin AlleyDanielle Gong is a 43 year old woman who has been seeing Dr. Lisette Grinderarlson.  She had cervical spine fusion by Dr. Kyung BaccaSalvant earlier this year.  Prior to the surgery, she reported having some burning sensation in her shoulder and this did not respond to the surgery and she had seen Dr. Lisette Grinderarlson for evaluation. She then had a cortisone injection by Dr. Katrinka BlazingSmith without improvement in her right shoulder.  Dr. Lisette Grinderarlson saw her and had to get an MRI of her right shoulder and asked that I see her for evaluation.  The original x-rays of the shoulder were reviewed and they showed no obvious abnormality including no sign of arthritis.  An MRI was done, which shows mild DJD of the [facility] joint and some minimal hooking of the anterior acromion.  Rotator cuff appears normal, as does the biceps.    Past Medical/Surgical History:    Disease/disorder Date Side Surgery Date Side Comments   Anemia         Asthma         Arthritis         Fibromyalgia         Headache, migraine         Obesity         Spinal stenosis         Osteoarthritis            Fibroadenoma removed 1992        Breast reduction 2009        Spinal fusion, cervical 2015       Allergies:    Ingredient Reaction Medication Name Comment   AMOXICILLIN   AMOXICILLIN   RIZATRIPTAN BENZOATE  Maxalt        Current Medications:    Brand Dose Sig Desc   FLEXERIL 10 mg    NORCO 10 mg-325 mg    PERCOCET 5 mg-325 mg    NEXIUM 40 mg    CAMILA UNKNOWN take 1 tablet by oral route  every day     Social History:    SMOKING  Status Tobacco Type Units Per Day Yrs Used   Never smoker      ALCOHOL  There is no history of alcohol use.     Family History:    Disease Detail Family Member Age Cause of Death   Family history of Arthritis   N   Family history of Asthma   N   Family history of Cancer, unknown   N   Family history of Cardiovascular disease   N    Family history of Diabetes mellitus   N   Family history of Hypertension   N   Family history of Renal disease   N   Family history of Stroke   N     Review of Systems:  A complete review of systems was completed.  Pertinent positives include headaches, dizziness, hearing loss, recent cold, itchy eyes, muscle weakness, numbness, shortness of breath, nausea/vomiting, indigestion, tendonitis, joint stiffness, and joint pain.  Pertinent negatives include fever, chills, night sweats, nausea, vomiting, dark stools, chest pain, shortness of breath, edema, visual changes, bowel or bladder dysfunction, hematuria,  wheezing, cough, tuberculosis, rashes, bruising or difficulty sleeping.    Vitals:  Date BP mm/Hg Pulse/min Resp/min Height (Total in.) Weight (lbs.) BMI   01/25/2014 145/102  116  59.00  41.20     Physical Examination:  General:  Patient in no acute distress.  Vital Signs: See database for vital signs.   HEENT: Normal.  Neck: Supple.  Chest: Clear.  Heart: Regular sinus rhythm.  Abdomen: Soft, nontender, no adenopathy.   Neurologic: Normal exam.  Extremities:     On physical exam, she has limitation with motion of her cervical spine, but movements did not reproduce her shoulder pain.  She has generally good motion of her right shoulder, actively and passively, reaching 90 degrees of abduction and 100 degrees with forward flexion actively.  She has no pain with abduction regardless of forearm position, but she has significant pain with Speed's and O'Brien's maneuvers.  She has the pain that is described anteriorly in the shoulder.    Impression:  Right shoulder impingement with probable biceps instability or partial tear; possible SLAP tear.    Plan:  She will be scheduled for a right shoulder arthroscopic evaluation with decompression and probable biceps release.  She understands the risks and benefits of the procedure and she is ready to proceed.  She will be given a sling postop.  She will get Dilaudid  2 mg.

## 2014-03-05 ENCOUNTER — Inpatient Hospital Stay: Payer: BLUE CROSS/BLUE SHIELD

## 2014-03-05 LAB — HCG QL SERUM: HCG, Ql.: NEGATIVE

## 2014-03-05 MED ORDER — ROPIVACAINE (PF) 5 MG/ML (0.5 %) INJECTION
5 mg/mL (0. %) | INTRAMUSCULAR | Status: DC | PRN
Start: 2014-03-05 — End: 2014-03-05
  Administered 2014-03-05: 16:00:00 via PERINEURAL

## 2014-03-05 MED ORDER — MIDAZOLAM 1 MG/ML IJ SOLN
1 mg/mL | INTRAMUSCULAR | Status: AC
Start: 2014-03-05 — End: ?

## 2014-03-05 MED ORDER — HYDROMORPHONE (PF) 1 MG/ML IJ SOLN
1 mg/mL | INTRAMUSCULAR | Status: DC | PRN
Start: 2014-03-05 — End: 2014-03-05

## 2014-03-05 MED ORDER — PROMETHAZINE 25 MG/ML INJECTION
25 mg/mL | Freq: Once | INTRAMUSCULAR | Status: DC
Start: 2014-03-05 — End: 2014-03-05

## 2014-03-05 MED ORDER — HYDROCODONE-ACETAMINOPHEN 5 MG-325 MG TAB
5-325 mg | ORAL | Status: DC | PRN
Start: 2014-03-05 — End: 2014-03-05

## 2014-03-05 MED ORDER — MIDAZOLAM 1 MG/ML IJ SOLN
1 mg/mL | INTRAMUSCULAR | Status: DC | PRN
Start: 2014-03-05 — End: 2014-03-05
  Administered 2014-03-05 (×2): via INTRAVENOUS

## 2014-03-05 MED ORDER — ROCURONIUM 10 MG/ML IV
10 mg/mL | INTRAVENOUS | Status: DC | PRN
Start: 2014-03-05 — End: 2014-03-05
  Administered 2014-03-05: 16:00:00 via INTRAVENOUS

## 2014-03-05 MED ORDER — FENTANYL CITRATE (PF) 50 MCG/ML IJ SOLN
50 mcg/mL | INTRAMUSCULAR | Status: AC
Start: 2014-03-05 — End: ?

## 2014-03-05 MED ORDER — PROPOFOL 10 MG/ML IV EMUL
10 mg/mL | INTRAVENOUS | Status: DC | PRN
Start: 2014-03-05 — End: 2014-03-05
  Administered 2014-03-05: 16:00:00 via INTRAVENOUS

## 2014-03-05 MED ORDER — BUPIVACAINE (PF) 0.5 % (5 MG/ML) IJ SOLN
0.5 % (5 mg/mL) | INTRAMUSCULAR | Status: DC | PRN
Start: 2014-03-05 — End: 2014-03-05
  Administered 2014-03-05: 17:00:00

## 2014-03-05 MED ORDER — HYDROMORPHONE (PF) 1 MG/ML IJ SOLN
1 mg/mL | INTRAMUSCULAR | Status: AC
Start: 2014-03-05 — End: ?

## 2014-03-05 MED ORDER — LACTATED RINGERS IV
INTRAVENOUS | Status: DC
Start: 2014-03-05 — End: 2014-03-05

## 2014-03-05 MED ORDER — FENTANYL CITRATE (PF) 50 MCG/ML IJ SOLN
50 mcg/mL | INTRAMUSCULAR | Status: DC | PRN
Start: 2014-03-05 — End: 2014-03-05
  Administered 2014-03-05 (×4): via INTRAVENOUS

## 2014-03-05 MED ORDER — LACTATED RINGERS IV
INTRAVENOUS | Status: DC | PRN
Start: 2014-03-05 — End: 2014-03-05
  Administered 2014-03-05 (×2): via INTRAVENOUS

## 2014-03-05 MED ORDER — NALBUPHINE 10 MG/ML INJECTION
10 mg/mL | INTRAMUSCULAR | Status: DC | PRN
Start: 2014-03-05 — End: 2014-03-05

## 2014-03-05 MED ORDER — ONDANSETRON (PF) 4 MG/2 ML INJECTION
4 mg/2 mL | INTRAMUSCULAR | Status: DC | PRN
Start: 2014-03-05 — End: 2014-03-05
  Administered 2014-03-05: 16:00:00 via INTRAVENOUS

## 2014-03-05 MED ORDER — LACTATED RINGERS IV
INTRAVENOUS | Status: DC
Start: 2014-03-05 — End: 2014-03-05
  Administered 2014-03-05: 15:00:00 via INTRAVENOUS

## 2014-03-05 MED ORDER — DEXAMETHASONE SODIUM PHOSPHATE 4 MG/ML IJ SOLN
4 mg/mL | INTRAMUSCULAR | Status: DC | PRN
Start: 2014-03-05 — End: 2014-03-05
  Administered 2014-03-05: 16:00:00 via INTRAVENOUS

## 2014-03-05 MED ORDER — LIDOCAINE (PF) 20 MG/ML (2 %) IJ SOLN
20 mg/mL (2 %) | INTRAMUSCULAR | Status: DC | PRN
Start: 2014-03-05 — End: 2014-03-05
  Administered 2014-03-05: 16:00:00 via INTRAVENOUS

## 2014-03-05 MED ORDER — SODIUM CHLORIDE 0.9 % IJ SYRG
INTRAMUSCULAR | Status: DC | PRN
Start: 2014-03-05 — End: 2014-03-05

## 2014-03-05 MED ORDER — BUPIVACAINE-EPINEPHRINE (PF) 0.5 %-1:200,000 IJ SOLN
0.5 %-1:200,000 | INTRAMUSCULAR | Status: DC | PRN
Start: 2014-03-05 — End: 2014-03-05
  Administered 2014-03-05: 17:00:00 via SUBCUTANEOUS

## 2014-03-05 MED ORDER — SODIUM CHLORIDE 0.9 % IV PIGGY BACK
6004 mg/4 mL | Freq: Once | INTRAVENOUS | Status: AC
Start: 2014-03-05 — End: 2014-03-05
  Administered 2014-03-05: 16:00:00 via INTRAVENOUS

## 2014-03-05 MED ORDER — DIPHENHYDRAMINE HCL 50 MG/ML IJ SOLN
50 mg/mL | INTRAMUSCULAR | Status: DC | PRN
Start: 2014-03-05 — End: 2014-03-05

## 2014-03-05 MED ORDER — BUPIVACAINE (PF) 0.5 % (5 MG/ML) IJ SOLN
0.5 % (5 mg/mL) | INTRAMUSCULAR | Status: AC
Start: 2014-03-05 — End: ?

## 2014-03-05 MED ORDER — BUPIVACAINE-EPINEPHRINE (PF) 0.5 %-1:200,000 IJ SOLN
0.5 %-1:200,000 | INTRAMUSCULAR | Status: AC
Start: 2014-03-05 — End: ?

## 2014-03-05 MED ORDER — HYDROMORPHONE 2 MG TAB
2 mg | ORAL_TABLET | ORAL | Status: AC | PRN
Start: 2014-03-05 — End: ?

## 2014-03-05 MED FILL — SENSORCAINE-MPF 0.5 % (5 MG/ML) INJECTION SOLUTION: 0.5 % (5 mg/mL) | INTRAMUSCULAR | Qty: 180

## 2014-03-05 MED FILL — MIDAZOLAM 1 MG/ML IJ SOLN: 1 mg/mL | INTRAMUSCULAR | Qty: 5

## 2014-03-05 MED FILL — BD POSIFLUSH NORMAL SALINE 0.9 % INJECTION SYRINGE: INTRAMUSCULAR | Qty: 10

## 2014-03-05 MED FILL — HYDROMORPHONE (PF) 1 MG/ML IJ SOLN: 1 mg/mL | INTRAMUSCULAR | Qty: 1

## 2014-03-05 MED FILL — FENTANYL CITRATE (PF) 50 MCG/ML IJ SOLN: 50 mcg/mL | INTRAMUSCULAR | Qty: 5

## 2014-03-05 MED FILL — CLINDAMYCIN 600 MG/4 ML IV: 600 mg/4 mL | INTRAVENOUS | Qty: 4

## 2014-03-05 MED FILL — LACTATED RINGERS IV: INTRAVENOUS | Qty: 1000

## 2014-03-05 MED FILL — MARCAINE-EPINEPHRINE (PF) 0.5 %-1:200,000 INJECTION SOLUTION: 0.5 %-1:200,000 | INTRAMUSCULAR | Qty: 30

## 2014-03-05 NOTE — Other (Signed)
TRANSFER - IN REPORT:    Verbal report received from Cortni RN on Robin Ramsey  being received from PACU for routine post - op      Report consisted of patient???s Situation, Background, Assessment and   Recommendations(SBAR).     Information from the following report(s) Procedure Summary, Intake/Output and MAR was reviewed with the receiving nurse.    Opportunity for questions and clarification was provided.      Assessment completed upon patient???s arrival to unit and care assumed.

## 2014-03-05 NOTE — Anesthesia Post-Procedure Evaluation (Signed)
Post-Anesthesia Evaluation and Assessment    Cardiovascular Function/Vital Signs  Visit Vitals   Item Reading   ??? BP 109/75 mmHg   ??? Pulse 90   ??? Temp 36.1 ??C (97 ??F)   ??? Resp 18   ??? Ht 4' 11.5" (1.511 m)   ??? Wt 93.243 kg (205 lb 9 oz)   ??? BMI 40.84 kg/m2   ??? SpO2 94%       Patient is status post Procedure(s):  RIGHT SHOULDER ARTHROSCOPY,DECOMPRESSION, GENERAL ANESTHESIA W/SCALENE BLOCK PRN.    Nausea/Vomiting: Controlled.    Postoperative hydration reviewed and adequate.    Pain:  Pain Scale 1: Numeric (0 - 10) (03/05/14 1306)  Pain Intensity 1: 0 (03/05/14 1306)   Managed.    Neurological Status:   Neuro (WDL): Within Defined Limits (03/05/14 1248)   At baseline.    Mental Status and Level of Consciousness: Arousable.    Pulmonary Status:   O2 Device: Room air (03/05/14 1306)   Adequate oxygenation and airway patent.    Complications related to anesthesia: None    Post-anesthesia assessment completed. No concerns.    Patient has met all discharge requirements.    Signed By: Everett Graff, MD    March 05, 2014

## 2014-03-05 NOTE — Other (Signed)
Dilaudid Care note printed and reviewed with pt and family member

## 2014-03-05 NOTE — Other (Signed)
.  TRANSFER - OUT REPORT:    Verbal report given to Misty StanleyLisa RN on [name]  being transferred to phase 2 for routine post - op       Report consisted of patient???s Situation, Background, Assessment and   Recommendations(SBAR).     Information from the following report(s) SBAR, Kardex, OR Summary, Procedure Summary, Intake/Output and MAR was reviewed with the receiving nurse.    Lines:   Peripheral IV 03/05/14 Left Forearm (Active)   Site Assessment Clean, dry, & intact 03/05/2014 12:48 PM   Phlebitis Assessment 0 03/05/2014 12:48 PM   Infiltration Assessment 0 03/05/2014 12:48 PM   Dressing Status Clean, dry, & intact 03/05/2014 12:48 PM   Dressing Type Tape;Transparent 03/05/2014 12:48 PM   Hub Color/Line Status Pink;Infusing 03/05/2014 12:48 PM       Subcutaneous Infusion Line 03/05/14 Right Shoulder (Active)   Site Assessment Clean, dry, & intact 03/05/2014 12:48 PM   Dressing Status Clean, dry, & intact 03/05/2014 12:48 PM   Dressing Type Tape;Transparent 03/05/2014 12:48 PM   Hub Color/Line Status Infusing 03/05/2014 12:48 PM        Opportunity for questions and clarification was provided.      Patient transported with:   Registered Nurse

## 2014-03-05 NOTE — Anesthesia Procedure Notes (Signed)
Peripheral Block    Start time: 03/05/2014 10:47 AM  End time: 03/05/2014 10:52 AM  Block Type: right interscalene  Reason for block: at surgeon's request and post-op pain management  Staffing  Anesthesiologist: Ivor MessierGIANTURCO, Nayah Lukens  Performed by: anesthesiologist   Prep  Risks and benefits discussed with the patient and plans are to proceed  Site marked, Timeout performed, 10:47  Monitoring: standard ASA monitoring, continuous pulse ox, frequent vital sign checks, heart rate, responsive to questions and oxygen  Injection Technique: single-shot  Procedures: ultrasound guided and nerve stimulator  Patient was placed in supine position  Prep Solution(s): chlorhexidine  Region: interscalene  Needle  Needle: 21G Stimuplex  Needle localization: nerve stimulator and ultrasound guidance  Minimal motor response <0.5 mA and >0.3 mA  Medication Injected: 25mL 0.5% ropivacaine    Assessment  Injection Assessment: incremental injection every 5 mL, local visualized surrounding nerve on ultrasound, negative aspiration for blood, no paresthesia, no intravascular symptoms, negative aspiration for CSF and ultrasound image on chart  Patient tolerated without any apparent complications  Additional Notes  Blocks Interscalene    Operator:Newton Frutiger    Procedure and alternatives explained.  Risks explained including bleeding, infection, nerve injury, failed block.  Procedure explained as elective.  Pt understands and desires to proceed.    Time out performed, correct patient, side, site, and procedure verified.  Patient placed in supine position.   right neck marked and prepped with chlorhexadine.  Standard monitors applied and O2 provided via nasal cannula.  Adequate sedation provided with 4 mg Midazolam and 50 ug Fentanyl.  Target nerves identified by ultrasound and nerve stimulator, 25 ml's 0.5% Ropivacaine injected in divided doses with negative aspiration through 22 gauge stimulating needle.  Patient tolerated procedure well,  vital signs stable throughout, with no apparent complications.    Patient able to move  right fingers/hand/wrist/elbow/arm/shoulder after block.  No sensory or motor block.

## 2014-03-05 NOTE — Other (Signed)
Handoff Report from Operating Room to PACU   Report received from [facility], CRNA regarding patient, Robin Ramsey .   Surgeon(s): Effie Shyoleman, MD and Procedure confirmed with allergies and dressings discussed.   Anesthesia type, drugs, patient history, complications, estimated blood loss, vital signs, intake and output, and last pain medication, lines, reversal medications and temperature were reviewed.   PACU monitoring initiated. Non-rebreather mask with 10 liters 02 applied. 02Sat 100%. Patient is drowsy, able to Follow commands. Dressing to right shoulder CDI, PIV #20 g left arm, infusing without difficulty. See Doc flowsheet for physical assessment details.   Cortni Marian SorrowKunzie, RCharity fundraiser

## 2014-03-05 NOTE — Anesthesia Pre-Procedure Evaluation (Signed)
Anesthetic History     PONV          Review of Systems / Medical History  Patient summary reviewed, nursing notes reviewed and pertinent labs reviewed    Pulmonary            Asthma        Neuro/Psych   Within defined limits           Cardiovascular                  Exercise tolerance: >4 METS     GI/Hepatic/Renal     GERD      PUD     Endo/Other        Morbid obesity and arthritis     Other Findings              Physical Exam    Airway  Mallampati: III  TM Distance: 4 - 6 cm  Neck ROM: decreased range of motion   Mouth opening: Normal     Cardiovascular  Regular rate and rhythm,  S1 and S2 normal,  no murmur, click, rub, or gallop  Rhythm: regular  Rate: normal         Dental  No notable dental hx       Pulmonary  Breath sounds clear to auscultation               Abdominal  GI exam deferred       Other Findings            Anesthetic Plan    ASA: 3  Anesthesia type: general      Post-op pain plan if not by surgeon: peripheral nerve block single    Induction: Intravenous  Anesthetic plan and risks discussed with: Patient

## 2014-03-05 NOTE — Interval H&P Note (Signed)
H&P Update:  Erick AlleyDanielle Oates was seen and examined.  History and physical has been reviewed. There have been no significant clinical changes since the completion of the originally dated History and Physical.  Patient identified by surgeon; surgical site was confirmed by patient and surgeon.    Signed By: Trinna BalloonMARTIN R Bernabe Dorce, MD     March 05, 2014 10:33 AM

## 2014-03-05 NOTE — Anesthesia Procedure Notes (Signed)
Peripheral Block    Start time: 03/05/2014 10:47 AM  End time: 03/05/2014 10:52 AM  Block Type: right interscalene  Reason for block: at surgeon's request and post-op pain management  Staffing  Anesthesiologist: Ivor MessierGIANTURCO, Junella Domke  Performed by: anesthesiologist   Prep  Risks and benefits discussed with the patient and plans are to proceed  Site marked, Timeout performed, 10:47  Monitoring: standard ASA monitoring, continuous pulse ox, frequent vital sign checks, heart rate, responsive to questions and oxygen  Injection Technique: single-shot  Procedures: ultrasound guided and nerve stimulator  Patient was placed in supine position  Prep Solution(s): chlorhexidine  Region: interscalene  Needle  Needle: 21G Stimuplex  Needle localization: nerve stimulator and ultrasound guidance  Minimal motor response <0.5 mA and >0.3 mA  Medication Injected: 25mL 0.5% ropivacaine    Assessment  Injection Assessment: incremental injection every 5 mL, local visualized surrounding nerve on ultrasound, negative aspiration for blood, no paresthesia, no intravascular symptoms, negative aspiration for CSF and ultrasound image on chart  Patient tolerated without any apparent complications  Additional Notes  Blocks Interscalene    Operator:Cleatis Fandrich    Procedure and alternatives explained.  Risks explained including bleeding, infection, nerve injury, failed block.  Procedure explained as elective.  Pt understands and desires to proceed.    Time out performed, correct patient, side, site, and procedure verified.  Patient placed in supine position.   right neck marked and prepped with chlorhexadine.  Standard monitors applied and O2 provided via nasal cannula.  Adequate sedation provided with 4 mg Midazolam and 50 ug Fentanyl.  Target nerves identified by ultrasound and nerve stimulator, 25 ml's 0.5% Ropivacaine injected in divided doses with negative aspiration through 22 gauge stimulating needle.  Patient tolerated procedure well, vital signs  stable throughout, with no apparent complications.    Patient able to move  right fingers/hand/wrist/elbow/arm/shoulder after block.  No sensory or motor block.

## 2014-03-05 NOTE — Op Note (Signed)
PREOPERATIVE DIAGNOSES:    1. Impingement right shoulder  2. Degenerative arthritis of the acromioclavicular joint.      POSTOPERATIVE DIAGNOSES:    1. Impingement right shoulder  2. Degenerative arthritis of the acromioclavicular joint.      PROCEDURES PERFORMED:    1. Arthroscopic decompression right shoulder  2. Resection distal clavicle.    SURGEON:  Leavy CellaMartin R. Effie Shyoleman, MD    ANESTHESIOLOGIST: Anesthesiologist: Ivor Messieraniel Gianturco, MD  CRNA: Marveen Reeksobert J Elbert Ewingseaton Jr., CRNA    ANESTHESIA:   General anesthesia after scalene block.      COMPLICATIONS:  None.    BLOOD LOSS:  Minimal.      DESCRIPTION OF PROCEDURE:  This 43 y.o.-year-old female, with a history of persistent pain in the right shoulder, unresponsive to conservative care.  The patient had a MRI showing the above noted findings and was then scheduled for surgery.    PROCEDURE:  The patient was taken to the operating room and given general anesthesia after a scalene block.  When it was adequate, the right shoulder was examined and showed full motion with no gross instability.  The patient was placed in a left lateral decubitus position.  The right shoulder was placed in traction, prepped and draped in a normal manner and injected with 30 mL of 1% Marcaine with Epinephrine.  Anterior and posterior stab wounds were made, and a standard arthroscopic examination was performed.  This revealed an intact supraspinatus..  The biceps and the superior labrum were normal, and the articular surfaces of the joint appeared normal.  The instruments were then redirected in the subacromial space.    A lateral portal was established and a limited bursectomy was carried out.  The rotator cuff was intact, but impingement signs were noted.  The soft tissue was removed from the anterior acromion and distal clavicle, including the coracoacromial ligament.  There was a sharp hooked appearance on the anterior acromion, and severe arthritic changes of the [facility] with complete loss of  joint cartilage, and large inferior osteophytes.  A high-speed bur was used to perform an acromioplasty.  The same level of resection was carried across the distal clavicle.  The arthroscope was switched to the lateral portal to assure that adequate bone removal had been achieved, and the result was a flat acromion.  The [facility] joint was approached directly through the anterior portal.  The most medial few millimeters of the acromion and the distal centimeter of the clavicle were removed arthroscopically.  Bleeding points were treated with the electrocautery wand for hemostasis.    The instruments were removed.  The pain pump catheter was left in the subacromial space.  The wounds were closed using 3-0 nylon suture.  A sterile compressive dressing was applied, along with a sling.  The patient left the operating room in satisfactory condition.

## 2014-03-05 NOTE — Other (Signed)
AVS med list reviewed and verified by Areatha KeasPenny Collins RN, no duplicate medications noted    Discharge paperwork reviewed for correct name by Lutricia FeilAngel Burrino

## 2014-03-05 NOTE — Op Note (Signed)
PREOPERATIVE DIAGNOSES:    1. Impingement right shoulder  2. Degenerative arthritis of the acromioclavicular joint.      POSTOPERATIVE DIAGNOSES:    1. Impingement right shoulder  2. Degenerative arthritis of the acromioclavicular joint.      PROCEDURES PERFORMED:    1. Arthroscopic decompression right shoulder  2. Resection distal clavicle.    SURGEON:  Leavy CellaMartin R. Effie Shyoleman, MD    ANESTHESIOLOGIST: Anesthesiologist: Ivor Messieraniel Gianturco, MD  CRNA: Marveen Reeksobert J Elbert Ewingseaton Jr., CRNA    ANESTHESIA:   General anesthesia after scalene block.      COMPLICATIONS:  None.    BLOOD LOSS:  Minimal.      DESCRIPTION OF PROCEDURE:  This 43 y.o.-year-old female, with a history of persistent pain in the right shoulder, unresponsive to conservative care.  The patient had a MRI showing the above noted findings and was then scheduled for surgery.    PROCEDURE:  The patient was taken to the operating room and given general anesthesia after a scalene block.  When it was adequate, the right shoulder was examined and showed full motion with no gross instability.  The patient was placed in a left lateral decubitus position.  The right shoulder was placed in traction, prepped and draped in a normal manner and injected with 30 mL of 1% Marcaine with Epinephrine.  Anterior and posterior stab wounds were made, and a standard arthroscopic examination was performed.  This revealed an intact supraspinatus..  The biceps and the superior labrum were normal, and the articular surfaces of the joint appeared normal.  The instruments were then redirected in the subacromial space.    A lateral portal was established and a limited bursectomy was carried out.  The rotator cuff was intact, but impingement signs were noted.  The soft tissue was removed from the anterior acromion and distal clavicle, including the coracoacromial ligament.  There was a sharp hooked appearance on the anterior acromion, and severe arthritic changes of the  [facility] joint with complete loss of joint cartilage, and large inferior osteophytes.  A high-speed bur was used to perform an acromioplasty.  The same level of resection was carried across the distal clavicle.  The arthroscope was switched to the lateral portal to assure that adequate bone removal had been achieved, and the result was a flat acromion.  The [facility] joint was approached directly through the anterior portal.  The most medial few millimeters of the acromion and the distal centimeter of the clavicle were removed arthroscopically.  Bleeding points were treated with the electrocautery wand for hemostasis.    The instruments were removed.  The pain pump catheter was left in the subacromial space.  The wounds were closed using 3-0 nylon suture.  A sterile compressive dressing was applied, along with a sling.  The patient left the operating room in satisfactory condition.

## 2014-03-06 MED FILL — ROCURONIUM 10 MG/ML IV: 10 mg/mL | INTRAVENOUS | Qty: 3

## 2014-03-06 MED FILL — DEXAMETHASONE SODIUM PHOSPHATE 4 MG/ML IJ SOLN: 4 mg/mL | INTRAMUSCULAR | Qty: 1

## 2014-03-06 MED FILL — PROPOFOL 10 MG/ML IV EMUL: 10 mg/mL | INTRAVENOUS | Qty: 10

## 2014-03-06 MED FILL — LACTATED RINGERS IV: INTRAVENOUS | Qty: 1500

## 2014-03-06 MED FILL — ONDANSETRON (PF) 4 MG/2 ML INJECTION: 4 mg/2 mL | INTRAMUSCULAR | Qty: 2

## 2014-03-06 MED FILL — XYLOCAINE-MPF 20 MG/ML (2 %) INJECTION SOLUTION: 20 mg/mL (2 %) | INTRAMUSCULAR | Qty: 5

## 2014-03-06 MED FILL — NAROPIN (PF) 5 MG/ML (0.5 %) INJECTION SOLUTION: 5 mg/mL (0. %) | INTRAMUSCULAR | Qty: 25

## 2014-11-02 ENCOUNTER — Emergency Department (HOSPITAL_COMMUNITY)
Admission: EM | Admit: 2014-11-02 | Discharge: 2014-11-02 | Disposition: A | Discharge status: Home / Self Care | Payer: PRIVATE HEALTH INSURANCE | Source: Home / Self Care | Attending: Family Medicine | Admitting: Family Medicine

## 2014-11-02 ENCOUNTER — Encounter (HOSPITAL_COMMUNITY): Payer: Self-pay | Admitting: Emergency Medicine

## 2014-11-02 DIAGNOSIS — T63441A Toxic effect of venom of bees, accidental (unintentional), initial encounter: Secondary | ICD-10-CM

## 2014-11-02 NOTE — ED Provider Notes (Signed)
CSN: [id number]     Arrival date & time 11/02/14  1510 History   First MD Initiated Contact with Patient 11/02/14 1603     Chief Complaint  Patient presents with  . Insect Bite   (Consider location/radiation/quality/duration/timing/severity/associated sxs/prior Treatment) HPI Comments: Patient presents with a bee sting to left hand at 1530 today. She has localized pain and swelling. ? History of allergies as a child. She denies no SOB, cough, rashes, or additional swelling. Feels well otherwise.   The history is provided by the patient.    Past Medical History  Diagnosis Date  . Asthma   . Bronchitis   . Migraine with aura, without mention of intractable migraine without mention of status migrainosus   . GERD (gastroesophageal reflux disease)   . Chronic migraine without aura, with intractable migraine, so stated, without mention of status migrainosus 03/05/2013   Past Surgical History  Procedure Laterality Date  . Breast surgery      reduction  . Uterine fibroid surgery     Family History  Problem Relation Age of Onset  . Diabetes Mother   . COPD Mother   . Hypertension Father   . Diabetes Father    History  Substance Use Topics  . Smoking status: Never Smoker   . Smokeless tobacco: Not on file  . Alcohol Use: Yes   OB History    No data available     Review of Systems  All other systems reviewed and are negative.   Allergies  Amoxicillin and Maxalt  Home Medications   Prior to Admission medications   Medication Sig Start Date End Date Taking? Authorizing Provider  albuterol (PROVENTIL HFA;VENTOLIN HFA) 108 (90 BASE) MCG/ACT inhaler Inhale 2 puffs into the lungs every 6 (six) hours as needed for wheezing.    Historical Provider, MD  aspirin-acetaminophen-caffeine (EXCEDRIN MIGRAINE) [phone] MG per tablet Take 1 tablet by mouth every 6 (six) hours as needed for pain.    Historical Provider, MD  Biotin 1 MG CAPS Take 1 g by mouth daily.    Historical  Provider, MD  butalbital-acetaminophen-caffeine (FIORICET, ESGIC) 50-325-40 MG per tablet Take 1 tablet by mouth as needed. 04/26/13   Huston Foley, MD  eletriptan (RELPAX) 40 MG tablet One tablet by mouth at onset of headache. May repeat in 2 hours if headache persists or recurs. may repeat in 2 hours if necessary    Historical Provider, MD  gabapentin (NEURONTIN) 300 MG capsule Take 2 capsules by mouth at bedtime. 02/01/13   Historical Provider, MD  isometheptene-acetaminophen-dichloralphenazone (MIDRIN) 65-325-100 MG capsule Take 1 capsule by mouth 4 (four) times daily as needed. Maximum 5 capsules in 12 hours for migraine headaches, 8 capsules in 24 hours for tension headaches.    Historical Provider, MD  lidocaine (LIDODERM) 5 % Place 1 patch onto the skin daily as needed. On foot. Remove & Discard patch within 12 hours or as directed by MD    Historical Provider, MD  ondansetron (ZOFRAN) 4 MG tablet Take 1 tablet (4 mg total) by mouth 2 (two) times daily as needed for nausea or vomiting. 02/13/13   Huston Foley, MD  piroxicam (FELDENE) 20 MG capsule Take 1 capsule by mouth daily. 02/15/13   Historical Provider, MD  propranolol ER (INDERAL LA) 80 MG 24 hr capsule Take 1 capsule (80 mg total) by mouth at bedtime. 01/29/13   Huston Foley, MD  vitamin B-12 (CYANOCOBALAMIN) 1000 MCG tablet Take 1,000 mcg by mouth daily.    Historical  Provider, MD   BP 130/85 mmHg  Pulse 82  Temp(Src) 98.6 F (37 C)  Resp 20  SpO2 99%  LMP 10/19/2014 Physical Exam  Constitutional: She is oriented to person, place, and time. She appears well-developed and well-nourished. No distress.  HENT:  Head: Normocephalic and atraumatic.  Cardiovascular: Normal rate and regular rhythm.   Pulmonary/Chest: Effort normal and breath sounds normal. No respiratory distress. She has no wheezes.  Neurological: She is alert and oriented to person, place, and time.  Skin: Skin is warm. No rash noted. She is not diaphoretic.   Psychiatric: Her behavior is normal.  Nursing note and vitals reviewed.   ED Course  Procedures (including critical care time) Labs Review Labs Reviewed - No data to display  Imaging Review No results found.   MDM   1. Bee sting, accidental or unintentional, initial encounter    No emergent needs. No anaphylaxis. Supportive care with cold compresses and Motrin.    Riki Sheer, PA-C 11/02/14 1630

## 2014-11-02 NOTE — Discharge Instructions (Signed)

## 2014-11-02 NOTE — ED Notes (Signed)
Reports bee sting to left hand onset 1530 Sx include swelling and pain.... Reports hx of allergy to bee stings Denies SOB/dyspnea No acute distress

## 2015-01-30 NOTE — Telephone Encounter (Signed)
Error

## 2016-08-13 ENCOUNTER — Ambulatory Visit (INDEPENDENT_AMBULATORY_CARE_PROVIDER_SITE_OTHER): Payer: BLUE CROSS/BLUE SHIELD

## 2016-08-13 ENCOUNTER — Ambulatory Visit (INDEPENDENT_AMBULATORY_CARE_PROVIDER_SITE_OTHER): Payer: BLUE CROSS/BLUE SHIELD | Admitting: Orthopaedic Surgery

## 2016-08-13 DIAGNOSIS — M25561 Pain in right knee: Secondary | ICD-10-CM | POA: Diagnosis not present

## 2016-08-13 DIAGNOSIS — M797 Fibromyalgia: Secondary | ICD-10-CM

## 2016-08-13 DIAGNOSIS — M25562 Pain in left knee: Secondary | ICD-10-CM

## 2016-08-13 DIAGNOSIS — G8929 Other chronic pain: Secondary | ICD-10-CM | POA: Diagnosis not present

## 2016-08-13 DIAGNOSIS — M25552 Pain in left hip: Secondary | ICD-10-CM

## 2016-08-13 MED ORDER — METHYLPREDNISOLONE ACETATE 40 MG/ML IJ SUSP
40.0000 mg | INTRAMUSCULAR | Status: AC | PRN
  Administered 2016-08-13: 40 mg via INTRA_ARTICULAR

## 2016-08-13 MED ORDER — BUPIVACAINE HCL 0.5 % IJ SOLN
2.0000 mL | INTRAMUSCULAR | Status: AC | PRN
  Administered 2016-08-13: 2 mL via INTRA_ARTICULAR

## 2016-08-13 MED ORDER — LIDOCAINE HCL 1 % IJ SOLN
2.0000 mL | INTRAMUSCULAR | Status: AC | PRN
  Administered 2016-08-13: 2 mL

## 2016-08-13 MED ORDER — LIDOCAINE HCL 1 % IJ SOLN
3.0000 mL | INTRAMUSCULAR | Status: AC | PRN
  Administered 2016-08-13: 3 mL

## 2016-08-13 MED ORDER — BUPIVACAINE HCL 0.5 % IJ SOLN
3.0000 mL | INTRAMUSCULAR | Status: AC | PRN
  Administered 2016-08-13: 3 mL via INTRA_ARTICULAR

## 2016-08-13 NOTE — Progress Notes (Signed)
Office Visit Note   Patient: Rebecca Walls           Date of Birth: Aug 29, 1970           MRN: [id number] Visit Date: 08/13/2016              Requested by: Sanda Linger, MD [facility] [address] [place], [place] [address] PCP: Sanda Linger, MD   Assessment & Plan: Visit Diagnoses:  1. Chronic pain of both knees   2. Pain in left hip   3. Fibromyalgia     Plan: I did perform a left knee injection to see if this will calm knee down. She could have a degenerative meniscal tear. Lateral hip was also injected for the bursitis. Overall I think her fibromyalgia is not well controlled has flared up. She is very tender to palpation throughout her body. I do recommend that she see her primary care doctor rheumatologist have her fibromyalgia better control.  Follow-Up Instructions: Return if symptoms worsen or fail to improve.   Orders:  Orders Placed This Encounter  Procedures  . XR KNEE 3 VIEW RIGHT  . XR KNEE 3 VIEW LEFT  . XR HIP UNILAT W OR W/O PELVIS 2-3 VIEWS LEFT   No orders of the defined types were placed in this encounter.     Procedures: Large Joint Inj Date/Time: 08/13/2016 11:28 AM Performed by: Tarry Kos Authorized by: Tarry Kos   Consent Given by:  Patient Timeout: prior to procedure the correct patient, procedure, and site was verified   Indications:  Pain Location:  Knee Site:  R knee Prep: patient was prepped and draped in usual sterile fashion   Needle Size:  22 G Ultrasound Guidance: No   Fluoroscopic Guidance: No   Arthrogram: No   Medications:  2 mL lidocaine 1 %; 2 mL bupivacaine 0.5 %; 40 mg methylPREDNISolone acetate 40 MG/ML Patient tolerance:  Patient tolerated the procedure well with no immediate complications Large Joint Inj Date/Time: 08/13/2016 11:29 AM Performed by: Tarry Kos Authorized by: Tarry Kos   Consent Given by:  Patient Timeout: prior to procedure the correct  patient, procedure, and site was verified   Indications:  Pain Location:  Hip Site:  L greater trochanter Prep: patient was prepped and draped in usual sterile fashion   Needle Size:  22 G Approach:  Lateral Ultrasound Guidance: No   Fluoroscopic Guidance: No   Arthrogram: No   Medications:  3 mL lidocaine 1 %; 3 mL bupivacaine 0.5 %; 40 mg methylPREDNISolone acetate 40 MG/ML     Clinical Data: No additional findings.   Subjective: Chief Complaint  Patient presents with  . Left Hip - Pain  . Right Knee - Pain  . Left Knee - Pain    Patient is a 46 year old female comes in joint complaints. She has fallen 4 times over the last 5-6 months. He is also left hip pain on the lateral aspect when she is had injections for the past which have right her with temporary relief. She also was diagnosed with meniscal tear in her left knee back in 2009 which did not require surgery. She feels like this pain is getting worse. There is stiffness and she sits a lot at work. She is been endorsing swelling. She is also use heat and ice. She denies any injections. The pain does shoot down the leg    Review of Systems  Constitutional: Negative.  HENT: Negative.   Eyes: Negative.   Respiratory: Negative.   Cardiovascular: Negative.   Endocrine: Negative.   Musculoskeletal: Negative.   Neurological: Negative.   Hematological: Negative.   Psychiatric/Behavioral: Negative.   All other systems reviewed and are negative.    Objective: Vital Signs: There were no vitals taken for this visit.  Physical Exam  Constitutional: She is oriented to person, place, and time. She appears well-developed and well-nourished.  HENT:  Head: Normocephalic and atraumatic.  Eyes: EOM are normal.  Neck: Neck supple.  Pulmonary/Chest: Effort normal.  Abdominal: Soft.  Neurological: She is alert and oriented to person, place, and time.  Skin: Skin is warm. Capillary refill takes less than 2 seconds.    Psychiatric: She has a normal mood and affect. Her behavior is normal. Judgment and thought content normal.  Nursing note and vitals reviewed.   Ortho Exam Left knee exam shows no joint effusion. Overall the exam is more consistent with a chronic pain syndrome. She is very jumpy with any sort of light touch to skin. Left hip exam also shows slightly pain out of proportion. Lateral hip is tender. Very painful range of motion and guarding with movement of the hip. Specialty Comments:  No specialty comments available.  Imaging: Xr Hip Unilat W Or W/o Pelvis 2-3 Views Left  Result Date: 08/13/2016 No significant degenerative joint disease  Xr Knee 3 View Left  Result Date: 08/13/2016 No acute structural abnormalities  Xr Knee 3 View Right  Result Date: 08/13/2016 No acute or structural abnormalities    PMFS History: Patient Active Problem List   Diagnosis Date Noted  . Chronic migraine without aura, with intractable migraine, so stated, without mention of status migrainosus 03/05/2013   Past Medical History:  Diagnosis Date  . Asthma   . Bronchitis   . Chronic migraine without aura, with intractable migraine, so stated, without mention of status migrainosus 03/05/2013  . GERD (gastroesophageal reflux disease)   . Migraine with aura, without mention of intractable migraine without mention of status migrainosus     Family History  Problem Relation Age of Onset  . Diabetes Mother   . COPD Mother   . Hypertension Father   . Diabetes Father     Past Surgical History:  Procedure Laterality Date  . BREAST SURGERY     reduction  . UTERINE FIBROID SURGERY     Social History   Occupational History  . Not on file.   Social History Main Topics  . Smoking status: Never Smoker  . Smokeless tobacco: Not on file  . Alcohol use Yes  . Drug use: No  . Sexual activity: Not on file

## 2016-08-18 ENCOUNTER — Telehealth (INDEPENDENT_AMBULATORY_CARE_PROVIDER_SITE_OTHER): Payer: Self-pay | Admitting: Orthopaedic Surgery

## 2016-08-18 NOTE — Telephone Encounter (Signed)
Patient called wanting to know if the packet from [employer] that was faxed over the other day has been filled out? CB # [phone]

## 2016-08-18 NOTE — Telephone Encounter (Signed)
See message below °

## 2016-09-14 ENCOUNTER — Telehealth (INDEPENDENT_AMBULATORY_CARE_PROVIDER_SITE_OTHER): Payer: Self-pay | Admitting: Orthopaedic Surgery

## 2016-09-14 NOTE — Telephone Encounter (Signed)
Patient called advised [employer] faxed attention information last week to Dr Roda ShuttersXu to complete in order for her to be approved for a ergonomic desk. The number to contact patient is [phone]

## 2016-09-15 NOTE — Telephone Encounter (Signed)
Before I look all through [employer]'s desk, would this form have been given to you, or no?

## 2016-09-15 NOTE — Telephone Encounter (Signed)
Yes, I got it. I also filled it out and faxed it to [employer] yesterday 09/14/16.

## 2016-09-15 NOTE — Telephone Encounter (Signed)
Perfect, thank you

## 2017-07-15 ENCOUNTER — Ambulatory Visit (INDEPENDENT_AMBULATORY_CARE_PROVIDER_SITE_OTHER): Payer: BLUE CROSS/BLUE SHIELD | Admitting: Orthopaedic Surgery

## 2017-08-15 ENCOUNTER — Ambulatory Visit (INDEPENDENT_AMBULATORY_CARE_PROVIDER_SITE_OTHER): Payer: BLUE CROSS/BLUE SHIELD | Admitting: Orthopaedic Surgery

## 2017-08-19 ENCOUNTER — Ambulatory Visit (INDEPENDENT_AMBULATORY_CARE_PROVIDER_SITE_OTHER): Payer: BLUE CROSS/BLUE SHIELD | Admitting: Orthopaedic Surgery

## 2017-11-16 ENCOUNTER — Ambulatory Visit (INDEPENDENT_AMBULATORY_CARE_PROVIDER_SITE_OTHER): Payer: BLUE CROSS/BLUE SHIELD | Admitting: Orthopaedic Surgery

## 2017-11-22 ENCOUNTER — Other Ambulatory Visit: Payer: Self-pay

## 2017-11-22 ENCOUNTER — Encounter (HOSPITAL_BASED_OUTPATIENT_CLINIC_OR_DEPARTMENT_OTHER): Payer: Self-pay

## 2017-11-22 ENCOUNTER — Emergency Department (HOSPITAL_BASED_OUTPATIENT_CLINIC_OR_DEPARTMENT_OTHER)
Admission: EM | Admit: 2017-11-22 | Discharge: 2017-11-22 | Disposition: A | Discharge status: Home / Self Care | Payer: BLUE CROSS/BLUE SHIELD | Attending: Emergency Medicine | Admitting: Emergency Medicine

## 2017-11-22 ENCOUNTER — Emergency Department (HOSPITAL_BASED_OUTPATIENT_CLINIC_OR_DEPARTMENT_OTHER): Payer: BLUE CROSS/BLUE SHIELD

## 2017-11-22 DIAGNOSIS — M25572 Pain in left ankle and joints of left foot: Secondary | ICD-10-CM

## 2017-11-22 DIAGNOSIS — Z79899 Other long term (current) drug therapy: Secondary | ICD-10-CM | POA: Diagnosis not present

## 2017-11-22 HISTORY — DX: Bursopathy, unspecified: M71.9

## 2017-11-22 HISTORY — DX: Unspecified osteoarthritis, unspecified site: M19.90

## 2017-11-22 MED ORDER — PREDNISONE 10 MG (21) PO TBPK: ORAL_TABLET | Freq: Every day | ORAL | 0 refills | Status: AC

## 2017-11-22 NOTE — Discharge Instructions (Addendum)
1. Medications: Take steroid taper as prescribed.  Can also take [phone] mg of Tylenol every 6 hours as needed for pain. Do not exceed 4000 mg of Tylenol daily.  Take steroid taper with food to avoid upset stomach issues.  Do not take ibuprofen, Advil, Motrin, or Aleve while you are taking the steroid. 2. Treatment: rest, ice, elevate and use brace and crutches, drink plenty of fluids, gentle stretching 3. Follow Up: Please followup with orthopedics as directed or your PCP in 1 week if no improvement for discussion of your diagnoses and further evaluation after today's visit; Please return to the ER for worsening symptoms or other concerns such as worsening swelling, redness of the skin, fevers, loss of pulses, or loss of feeling
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
## 2017-11-22 NOTE — ED Triage Notes (Signed)
C/o felt a pop in left ankle 2 weeks ago[facility]cont[facility] to have pain, popping and swelling-NAD-limping gait

## 2017-11-28 ENCOUNTER — Ambulatory Visit: Payer: BLUE CROSS/BLUE SHIELD | Admitting: Family Medicine

## 2017-12-02 ENCOUNTER — Ambulatory Visit: Payer: BLUE CROSS/BLUE SHIELD | Admitting: Family Medicine

## 2019-01-17 IMAGING — DX DG ANKLE COMPLETE 3+V*L*
3 series · 3 of 3 positions shown · non-contrast
Comparison: None.

CLINICAL DATA: Left ankle pain since the patient felt a pop in the
ankle 2-3 weeks ago. Initial encounter.

EXAM:
LEFT ANKLE COMPLETE - 3+ VIEW

[ankle ap]
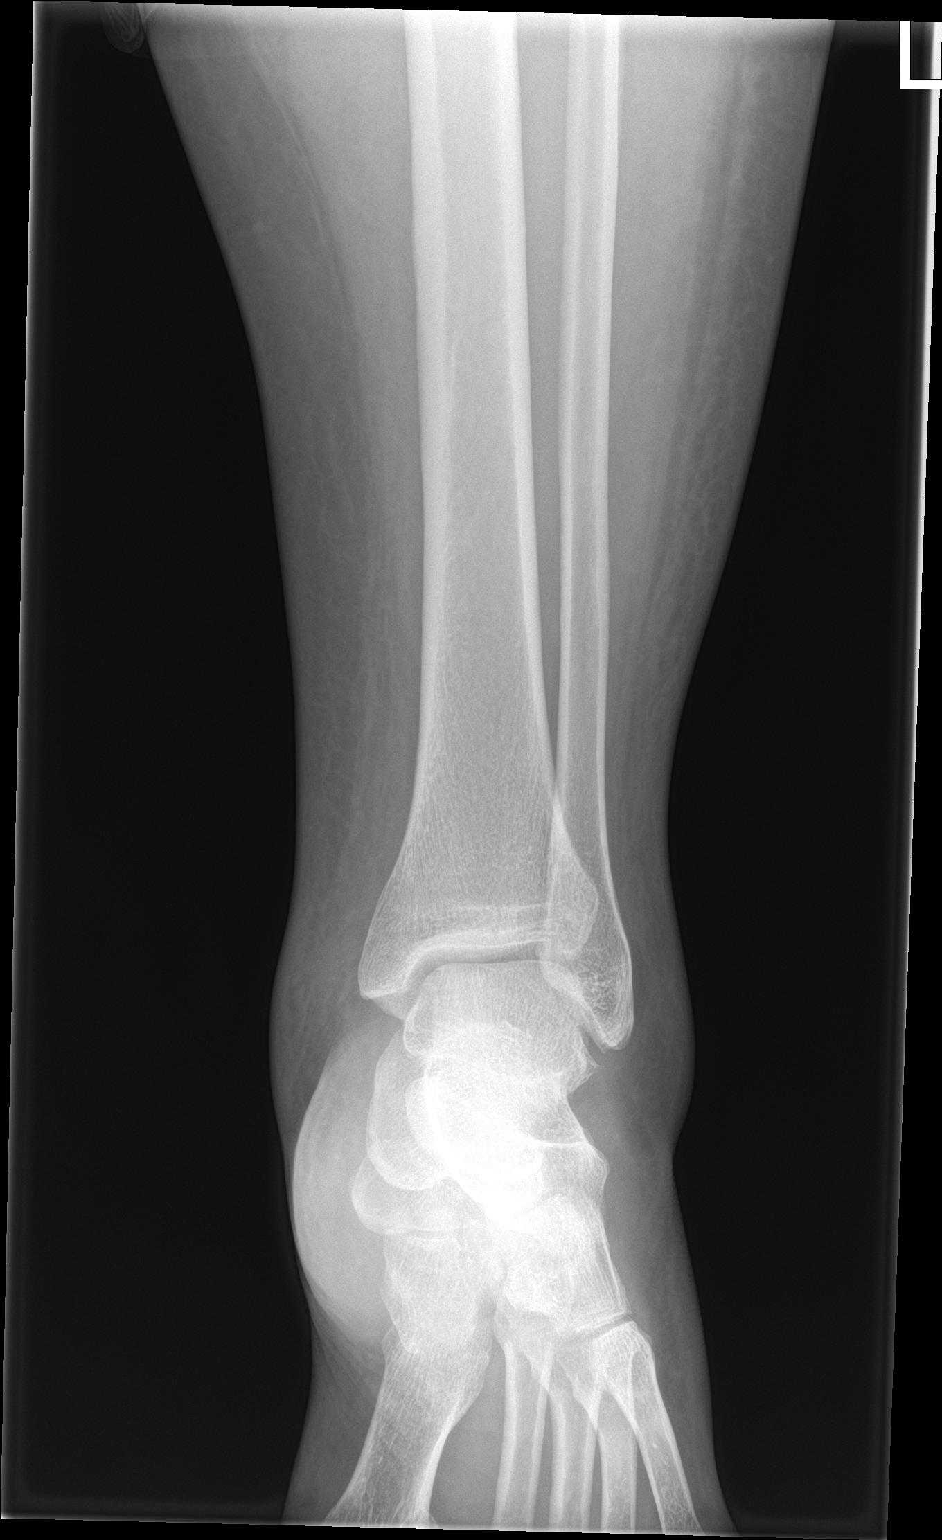

[ankle obl]
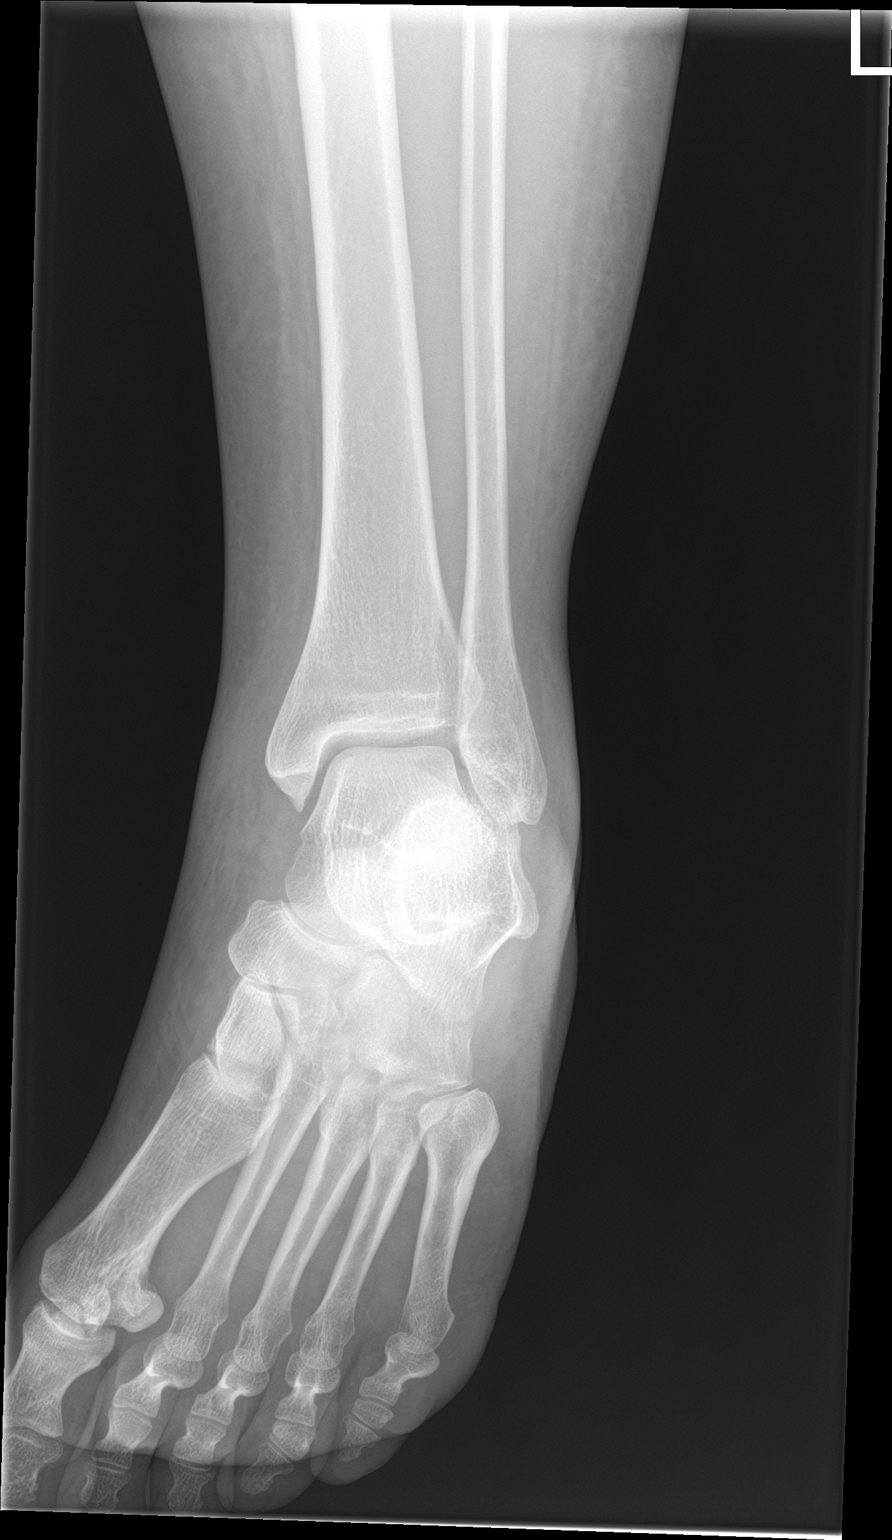

[ankle lat]
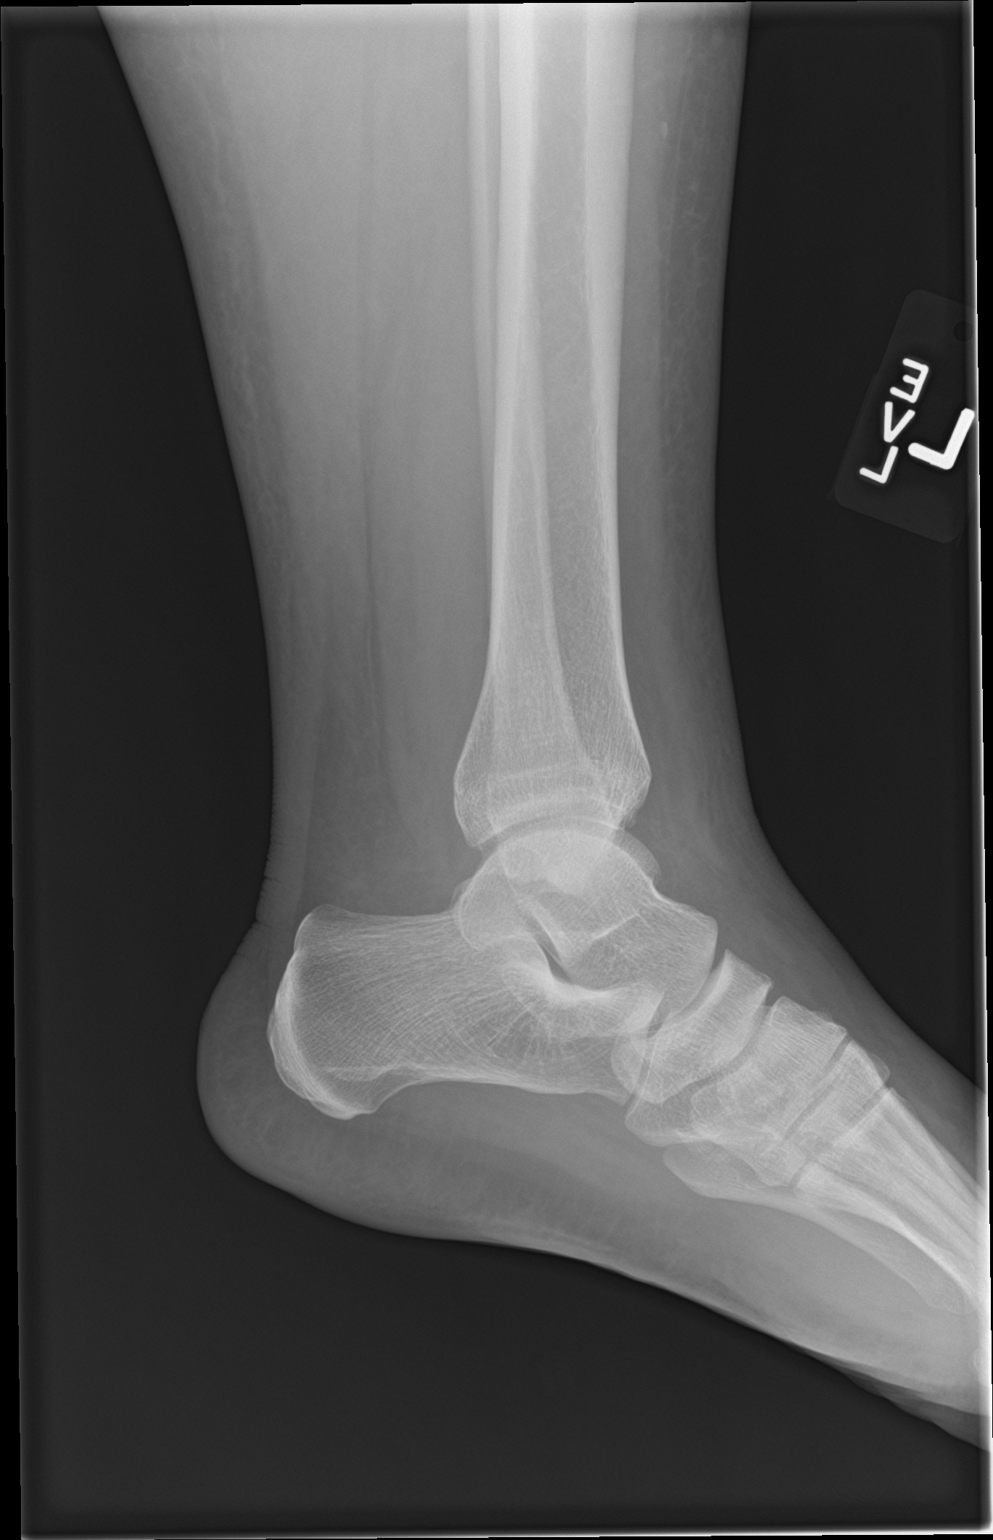

[3 of 3 positions shown; findings below may reference images not displayed]

FINDINGS: There is no evidence of fracture, dislocation, or joint effusion.
There is no evidence of arthropathy or other focal bone abnormality.
Soft tissues are unremarkable.
IMPRESSION: Negative exam.
